# Patient Record
Sex: Female | Born: 1970 | Hispanic: No | Marital: Married | Smoking: Never smoker
Health system: Southern US, Community
[De-identification: ages and names within clinical notes are randomized; demographics above are authoritative.]

## PROBLEM LIST (undated history)

## (undated) ENCOUNTER — Inpatient Hospital Stay (HOSPITAL_COMMUNITY): Payer: Self-pay

## (undated) DIAGNOSIS — T7840XA Allergy, unspecified, initial encounter: Secondary | ICD-10-CM

## (undated) DIAGNOSIS — M1990 Unspecified osteoarthritis, unspecified site: Secondary | ICD-10-CM

## (undated) DIAGNOSIS — O26893 Other specified pregnancy related conditions, third trimester: Secondary | ICD-10-CM

## (undated) DIAGNOSIS — J189 Pneumonia, unspecified organism: Secondary | ICD-10-CM

## (undated) DIAGNOSIS — R0600 Dyspnea, unspecified: Secondary | ICD-10-CM

## (undated) DIAGNOSIS — D219 Benign neoplasm of connective and other soft tissue, unspecified: Secondary | ICD-10-CM

## (undated) DIAGNOSIS — S27309A Unspecified injury of lung, unspecified, initial encounter: Secondary | ICD-10-CM

## (undated) DIAGNOSIS — O09529 Supervision of elderly multigravida, unspecified trimester: Secondary | ICD-10-CM

## (undated) DIAGNOSIS — J45909 Unspecified asthma, uncomplicated: Secondary | ICD-10-CM

## (undated) DIAGNOSIS — E079 Disorder of thyroid, unspecified: Secondary | ICD-10-CM

## (undated) HISTORY — PX: BREAST SURGERY: SHX581

## (undated) HISTORY — PX: OTHER SURGICAL HISTORY: SHX169

## (undated) HISTORY — PX: WISDOM TOOTH EXTRACTION: SHX21

## (undated) HISTORY — DX: Unspecified osteoarthritis, unspecified site: M19.90

## (undated) HISTORY — DX: Disorder of thyroid, unspecified: E07.9

## (undated) HISTORY — DX: Allergy, unspecified, initial encounter: T78.40XA

---

## 2002-06-27 DIAGNOSIS — C801 Malignant (primary) neoplasm, unspecified: Secondary | ICD-10-CM

## 2002-06-27 HISTORY — PX: BREAST SURGERY: SHX581

## 2002-06-27 HISTORY — DX: Malignant (primary) neoplasm, unspecified: C80.1

## 2012-10-18 ENCOUNTER — Other Ambulatory Visit: Payer: Self-pay | Admitting: Obstetrics and Gynecology

## 2012-10-22 ENCOUNTER — Other Ambulatory Visit: Payer: Self-pay

## 2013-08-04 ENCOUNTER — Inpatient Hospital Stay (HOSPITAL_COMMUNITY): Admission: AD | Admit: 2013-08-04 | Payer: Self-pay | Source: Ambulatory Visit | Admitting: Obstetrics and Gynecology

## 2014-02-14 LAB — OB RESULTS CONSOLE HIV ANTIBODY (ROUTINE TESTING): HIV: NONREACTIVE

## 2014-02-14 LAB — OB RESULTS CONSOLE RUBELLA ANTIBODY, IGM: Rubella: IMMUNE

## 2014-02-14 LAB — OB RESULTS CONSOLE ANTIBODY SCREEN: Antibody Screen: NEGATIVE

## 2014-02-14 LAB — OB RESULTS CONSOLE ABO/RH: RH TYPE: POSITIVE

## 2014-02-14 LAB — OB RESULTS CONSOLE HEPATITIS B SURFACE ANTIGEN: Hepatitis B Surface Ag: NEGATIVE

## 2014-06-25 ENCOUNTER — Encounter (HOSPITAL_COMMUNITY): Payer: Self-pay

## 2014-06-25 ENCOUNTER — Inpatient Hospital Stay (HOSPITAL_COMMUNITY)
Admission: AD | Admit: 2014-06-25 | Discharge: 2014-06-25 | Disposition: A | Discharge status: Home / Self Care | Payer: 59 | Source: Ambulatory Visit | Attending: Obstetrics and Gynecology | Admitting: Obstetrics and Gynecology

## 2014-06-25 DIAGNOSIS — O9989 Other specified diseases and conditions complicating pregnancy, childbirth and the puerperium: Secondary | ICD-10-CM | POA: Diagnosis not present

## 2014-06-25 DIAGNOSIS — R0602 Shortness of breath: Secondary | ICD-10-CM | POA: Diagnosis present

## 2014-06-25 DIAGNOSIS — Z3493 Encounter for supervision of normal pregnancy, unspecified, third trimester: Secondary | ICD-10-CM

## 2014-06-25 DIAGNOSIS — R0600 Dyspnea, unspecified: Secondary | ICD-10-CM | POA: Insufficient documentation

## 2014-06-25 DIAGNOSIS — R0689 Other abnormalities of breathing: Secondary | ICD-10-CM | POA: Diagnosis not present

## 2014-06-25 DIAGNOSIS — Z3A33 33 weeks gestation of pregnancy: Secondary | ICD-10-CM | POA: Diagnosis not present

## 2014-06-25 LAB — URINALYSIS, ROUTINE W REFLEX MICROSCOPIC
BILIRUBIN URINE: NEGATIVE
Glucose, UA: NEGATIVE mg/dL
Ketones, ur: NEGATIVE mg/dL
NITRITE: NEGATIVE
PROTEIN: NEGATIVE mg/dL
SPECIFIC GRAVITY, URINE: 1.01 (ref 1.005–1.030)
UROBILINOGEN UA: 0.2 mg/dL (ref 0.0–1.0)
pH: 7 (ref 5.0–8.0)

## 2014-06-25 LAB — URINE MICROSCOPIC-ADD ON

## 2014-06-25 NOTE — MAU Note (Signed)
Patient states she has had shortness of breath off and on for a couple of weeks, worse with movement. Wakes up at night with trouble breathing. Denies bleeding or leaking, no coughing or sore throat. Reports good fetal movement.

## 2014-06-25 NOTE — Discharge Instructions (Signed)
Third Trimester of Pregnancy The third trimester is from week 29 through week 42, months 7 through 9. The third trimester is a time when the fetus is growing rapidly. At the end of the ninth month, the fetus is about 20 inches in length and weighs 6-10 pounds.  BODY CHANGES Your body goes through many changes during pregnancy. The changes vary from woman to woman.   Your weight will continue to increase. You can expect to gain 25-35 pounds (11-16 kg) by the end of the pregnancy.  You may begin to get stretch marks on your hips, abdomen, and breasts.  You may urinate more often because the fetus is moving lower into your pelvis and pressing on your bladder.  You may develop or continue to have heartburn as a result of your pregnancy.  You may develop constipation because certain hormones are causing the muscles that push waste through your intestines to slow down.  You may develop hemorrhoids or swollen, bulging veins (varicose veins).  You may have pelvic pain because of the weight gain and pregnancy hormones relaxing your joints between the bones in your pelvis. Backaches may result from overexertion of the muscles supporting your posture.  You may have changes in your hair. These can include thickening of your hair, rapid growth, and changes in texture. Some women also have hair loss during or after pregnancy, or hair that feels dry or thin. Your hair will most likely return to normal after your baby is born.  Your breasts will continue to grow and be tender. A yellow discharge may leak from your breasts called colostrum.  Your belly button may stick out.  You may feel short of breath because of your expanding uterus.  You may notice the fetus "dropping," or moving lower in your abdomen.  You may have a bloody mucus discharge. This usually occurs a few days to a week before labor begins.  Your cervix becomes thin and soft (effaced) near your due date. WHAT TO EXPECT AT YOUR PRENATAL  EXAMS  You will have prenatal exams every 2 weeks until week 36. Then, you will have weekly prenatal exams. During a routine prenatal visit:  You will be weighed to make sure you and the fetus are growing normally.  Your blood pressure is taken.  Your abdomen will be measured to track your baby's growth.  The fetal heartbeat will be listened to.  Any test results from the previous visit will be discussed.  You may have a cervical check near your due date to see if you have effaced. At around 36 weeks, your caregiver will check your cervix. At the same time, your caregiver will also perform a test on the secretions of the vaginal tissue. This test is to determine if a type of bacteria, Group B streptococcus, is present. Your caregiver will explain this further. Your caregiver may ask you:  What your birth plan is.  How you are feeling.  If you are feeling the baby move.  If you have had any abnormal symptoms, such as leaking fluid, bleeding, severe headaches, or abdominal cramping.  If you have any questions. Other tests or screenings that may be performed during your third trimester include:  Blood tests that check for low iron levels (anemia).  Fetal testing to check the health, activity level, and growth of the fetus. Testing is done if you have certain medical conditions or if there are problems during the pregnancy. FALSE LABOR You may feel small, irregular contractions that  eventually go away. These are called Braxton Hicks contractions, or false labor. Contractions may last for hours, days, or even weeks before true labor sets in. If contractions come at regular intervals, intensify, or become painful, it is best to be seen by your caregiver.  SIGNS OF LABOR   Menstrual-like cramps.  Contractions that are 5 minutes apart or less.  Contractions that start on the top of the uterus and spread down to the lower abdomen and back.  A sense of increased pelvic pressure or back  pain.  A watery or bloody mucus discharge that comes from the vagina. If you have any of these signs before the 37th week of pregnancy, call your caregiver right away. You need to go to the hospital to get checked immediately. HOME CARE INSTRUCTIONS   Avoid all smoking, herbs, alcohol, and unprescribed drugs. These chemicals affect the formation and growth of the baby.  Follow your caregiver's instructions regarding medicine use. There are medicines that are either safe or unsafe to take during pregnancy.  Exercise only as directed by your caregiver. Experiencing uterine cramps is a good sign to stop exercising.  Continue to eat regular, healthy meals.  Wear a good support bra for breast tenderness.  Do not use hot tubs, steam rooms, or saunas.  Wear your seat belt at all times when driving.  Avoid raw meat, uncooked cheese, cat litter boxes, and soil used by cats. These carry germs that can cause birth defects in the baby.  Take your prenatal vitamins.  Try taking a stool softener (if your caregiver approves) if you develop constipation. Eat more high-fiber foods, such as fresh vegetables or fruit and whole grains. Drink plenty of fluids to keep your urine clear or pale yellow.  Take warm sitz baths to soothe any pain or discomfort caused by hemorrhoids. Use hemorrhoid cream if your caregiver approves.  If you develop varicose veins, wear support hose. Elevate your feet for 15 minutes, 3-4 times a day. Limit salt in your diet.  Avoid heavy lifting, wear low heal shoes, and practice good posture.  Rest a lot with your legs elevated if you have leg cramps or low back pain.  Visit your dentist if you have not gone during your pregnancy. Use a soft toothbrush to brush your teeth and be gentle when you floss.  A sexual relationship may be continued unless your caregiver directs you otherwise.  Do not travel far distances unless it is absolutely necessary and only with the approval  of your caregiver.  Take prenatal classes to understand, practice, and ask questions about the labor and delivery.  Make a trial run to the hospital.  Pack your hospital bag.  Prepare the baby's nursery.  Continue to go to all your prenatal visits as directed by your caregiver. SEEK MEDICAL CARE IF:  You are unsure if you are in labor or if your water has broken.  You have dizziness.  You have mild pelvic cramps, pelvic pressure, or nagging pain in your abdominal area.  You have persistent nausea, vomiting, or diarrhea.  You have a bad smelling vaginal discharge.  You have pain with urination. SEEK IMMEDIATE MEDICAL CARE IF:   You have a fever.  You are leaking fluid from your vagina.  You have spotting or bleeding from your vagina.  You have severe abdominal cramping or pain.  You have rapid weight loss or gain.  You have shortness of breath with chest pain.  You notice sudden or extreme swelling  of your face, hands, ankles, feet, or legs.  You have not felt your baby move in over an hour.  You have severe headaches that do not go away with medicine.  You have vision changes. Document Released: 06/07/2001 Document Revised: 06/18/2013 Document Reviewed: 08/14/2012 [facility] Patient Information 2015 [place], [place]. This information is not intended to replace advice given to you by your health care provider. Make sure you discuss any questions you have with your health care provider. Shortness of Breath Shortness of breath means you have trouble breathing. It could also mean that you have a medical problem. You should get immediate medical care for shortness of breath. CAUSES   Not enough oxygen in the air such as with high altitudes or a smoke-filled room.  Certain lung diseases, infections, or problems.  Heart disease or conditions, such as angina or heart failure.  Low red blood cells (anemia).  Poor physical fitness, which can cause shortness of breath  when you exercise.  Chest or back injuries or stiffness.  Being overweight.  Smoking.  Anxiety, which can make you feel like you are not getting enough air. DIAGNOSIS  Serious medical problems can often be found during your physical exam. Tests may also be done to determine why you are having shortness of breath. Tests may include:  Chest X-rays.  Lung function tests.  Blood tests.  An electrocardiogram (ECG).  An ambulatory electrocardiogram. An ambulatory ECG records your heartbeat patterns over a 24-hour period.  Exercise testing.  A transthoracic echocardiogram (TTE). During echocardiography, sound waves are used to evaluate how blood flows through your heart.  A transesophageal echocardiogram (TEE).  Imaging scans. Your health care provider may not be able to find a cause for your shortness of breath after your exam. In this case, it is important to have a follow-up exam with your health care provider as directed.  TREATMENT  Treatment for shortness of breath depends on the cause of your symptoms and can vary greatly. HOME CARE INSTRUCTIONS   Do not smoke. Smoking is a common cause of shortness of breath. If you smoke, ask for help to quit.  Avoid being around chemicals or things that may bother your breathing, such as paint fumes and dust.  Rest as needed. Slowly resume your usual activities.  If medicines were prescribed, take them as directed for the full length of time directed. This includes oxygen and any inhaled medicines.  Keep all follow-up appointments as directed by your health care provider. SEEK MEDICAL CARE IF:   Your condition does not improve in the time expected.  You have a hard time doing your normal activities even with rest.  You have any new symptoms. SEEK IMMEDIATE MEDICAL CARE IF:   Your shortness of breath gets worse.  You feel light-headed, faint, or develop a cough not controlled with medicines.  You start coughing up  blood.  You have pain with breathing.  You have chest pain or pain in your arms, shoulders, or abdomen.  You have a fever.  You are unable to walk up stairs or exercise the way you normally do. MAKE SURE YOU:  Understand these instructions.  Will watch your condition.  Will get help right away if you are not doing well or get worse. Document Released: 03/08/2001 Document Revised: 06/18/2013 Document Reviewed: 08/29/2011 [facility] Patient Information 2015 [place], [place]. This information is not intended to replace advice given to you by your health care provider. Make sure you discuss any questions you have with  your health care provider.

## 2014-06-25 NOTE — MAU Provider Note (Signed)
History     CSN: [id number]  Arrival date and time: 06/25/14 1102   None     Chief Complaint  Patient presents with  . Shortness of Breath   HPI Haley Strong is 43 y.o. G1P0 [redacted]w[redacted]d weeks presenting with difficulty with breathing with climbing steps, walking, sometimes at night.  Feels like she needs O2.  She denies chest pain, fever, chills and congestion.  Hx of recurrent pneumonia as a child.   She is a patient of Dr. Harrington Challenger.  Seen in the office today by Dr. Philis Pique. Sent here for futher evaluation.  Denies vaginal bleeding or leaking of fluid.  A little cramping this week but denies contractions  Pregnancy has been uncomplicated.  Hypoglycemia early on--careful with diet.    History reviewed. No pertinent past medical history.  History reviewed. No pertinent past surgical history.  Family History  Problem Relation Age of Onset  . Cancer Mother     History  Substance Use Topics  . Smoking status: Never Smoker   . Smokeless tobacco: Never Used  . Alcohol Use: No    Allergies:  Allergies  Allergen Reactions  . Sulfa Antibiotics Nausea And Vomiting    No prescriptions prior to admission    Review of Systems  Constitutional: Negative for fever, chills and malaise/fatigue.  Respiratory: Positive for shortness of breath. Negative for cough, hemoptysis, sputum production and wheezing.   Cardiovascular: Negative for chest pain and leg swelling.       + for difficulty breathing with walking, climbing stairs and sometimes at night  Gastrointestinal: Negative for heartburn and abdominal pain.  Genitourinary:       Neg for vaginal bleeding or leaking of fluid  Neurological: Negative for headaches.   Physical Exam   Blood pressure 108/80, pulse 91, temperature 98.2 F (36.8 C), temperature source Oral, resp. rate 16, height 5' 8.5" (1.74 m), weight 160 lb 9.6 oz (72.848 kg), SpO2 100 %.  Physical Exam  Constitutional: She is oriented to person, place, and time. She appears  well-developed and well-nourished. No distress.  HENT:  Head: Normocephalic.  Cardiovascular: Normal rate and regular rhythm.   Respiratory: Effort normal and breath sounds normal. No respiratory distress. She has no wheezes. She has no rales. She exhibits no tenderness.  Musculoskeletal: She exhibits no edema or tenderness.  In lower extremities  Neurological: She is alert and oriented to person, place, and time.  Skin: Skin is warm and dry.  Psychiatric: She has a normal mood and affect. Her behavior is normal.   Results for orders placed or performed during the hospital encounter of 06/25/14 (from the past 24 hour(s))  Urinalysis, Routine w reflex microscopic     Status: Abnormal   Collection Time: 06/25/14 12:16 PM  Result Value Ref Range   Color, Urine YELLOW YELLOW   APPearance CLEAR CLEAR   Specific Gravity, Urine 1.010 1.005 - 1.030   pH 7.0 5.0 - 8.0   Glucose, UA NEGATIVE NEGATIVE mg/dL   Hgb urine dipstick TRACE (A) NEGATIVE   Bilirubin Urine NEGATIVE NEGATIVE   Ketones, ur NEGATIVE NEGATIVE mg/dL   Protein, ur NEGATIVE NEGATIVE mg/dL   Urobilinogen, UA 0.2 0.0 - 1.0 mg/dL   Nitrite NEGATIVE NEGATIVE   Leukocytes, UA LARGE (A) NEGATIVE  Urine microscopic-add on     Status: Abnormal   Collection Time: 06/25/14 12:16 PM  Result Value Ref Range   Squamous Epithelial / LPF FEW (A) RARE   WBC, UA 11-20 <3 WBC/hpf  Bacteria, UA FEW (A) RARE   NST reactive  O2Sats--99% and 100%  MAU Course  Procedures  Discussed CXR with the patient-per Dr. Malachi Carl order--patient declines CXR  MDM Reported MSE to Dr. Rogue Bussing.  Reported patient declined CXR--she is not febrile, does not have congestion/productive cough, neg for chills  Assessment and Plan  A:  [redacted] weeks gestation with difficulty breathing  P:  Instructed patient to report any worsening of sxs, vaginal bleeding, decreased fetal movement, or loss of fluid to Dr. Harrington Challenger      Keep scheduled appointment              Lubertha Leite,EVE M 06/25/2014, 1:16 PM

## 2014-07-31 ENCOUNTER — Inpatient Hospital Stay (HOSPITAL_COMMUNITY)
Admission: AD | Admit: 2014-07-31 | Discharge: 2014-07-31 | Disposition: A | Discharge status: Home / Self Care | Payer: 59 | Source: Ambulatory Visit | Attending: Obstetrics and Gynecology | Admitting: Obstetrics and Gynecology

## 2014-07-31 ENCOUNTER — Encounter (HOSPITAL_COMMUNITY): Payer: Self-pay | Admitting: Family

## 2014-07-31 ENCOUNTER — Inpatient Hospital Stay (HOSPITAL_COMMUNITY): Payer: 59

## 2014-07-31 DIAGNOSIS — O9989 Other specified diseases and conditions complicating pregnancy, childbirth and the puerperium: Secondary | ICD-10-CM | POA: Insufficient documentation

## 2014-07-31 DIAGNOSIS — Z3A38 38 weeks gestation of pregnancy: Secondary | ICD-10-CM | POA: Diagnosis not present

## 2014-07-31 DIAGNOSIS — R0602 Shortness of breath: Secondary | ICD-10-CM | POA: Insufficient documentation

## 2014-07-31 DIAGNOSIS — R0682 Tachypnea, not elsewhere classified: Secondary | ICD-10-CM

## 2014-07-31 LAB — CBC
HEMATOCRIT: 34 % — AB (ref 36.0–46.0)
Hemoglobin: 12 g/dL (ref 12.0–15.0)
MCH: 30.9 pg (ref 26.0–34.0)
MCHC: 35.3 g/dL (ref 30.0–36.0)
MCV: 87.6 fL (ref 78.0–100.0)
Platelets: 139 10*3/uL — ABNORMAL LOW (ref 150–400)
RBC: 3.88 MIL/uL (ref 3.87–5.11)
RDW: 13.9 % (ref 11.5–15.5)
WBC: 9.1 10*3/uL (ref 4.0–10.5)

## 2014-07-31 LAB — COMPREHENSIVE METABOLIC PANEL
ALT: 29 U/L (ref 0–35)
ANION GAP: 3 — AB (ref 5–15)
AST: 28 U/L (ref 0–37)
Albumin: 3 g/dL — ABNORMAL LOW (ref 3.5–5.2)
Alkaline Phosphatase: 194 U/L — ABNORMAL HIGH (ref 39–117)
BUN: 11 mg/dL (ref 6–23)
CALCIUM: 8.7 mg/dL (ref 8.4–10.5)
CO2: 20 mmol/L (ref 19–32)
Chloride: 109 mmol/L (ref 96–112)
Creatinine, Ser: 0.66 mg/dL (ref 0.50–1.10)
GFR calc non Af Amer: >90 mL/min (ref >90)
Glucose, Bld: 89 mg/dL (ref 70–99)
Potassium: 3.6 mmol/L (ref 3.5–5.1)
SODIUM: 132 mmol/L — AB (ref 135–145)
Total Bilirubin: 0.5 mg/dL (ref 0.3–1.2)
Total Protein: 6.1 g/dL (ref 6.0–8.3)

## 2014-07-31 LAB — BRAIN NATRIURETIC PEPTIDE: B NATRIURETIC PEPTIDE 5: 30 pg/mL (ref 0.0–100.0)

## 2014-07-31 MED ORDER — ZOLPIDEM TARTRATE 5 MG PO TABS: 5.0000 mg | ORAL_TABLET | Freq: Every evening | ORAL | Status: DC | PRN

## 2014-07-31 MED ORDER — ALBUTEROL SULFATE HFA 108 (90 BASE) MCG/ACT IN AERS: 2.0000 | INHALATION_SPRAY | RESPIRATORY_TRACT | Status: AC | PRN
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
## 2014-07-31 NOTE — MAU Note (Signed)
Urine in lab 

## 2014-08-06 ENCOUNTER — Encounter (HOSPITAL_COMMUNITY): Payer: Self-pay | Admitting: *Deleted

## 2014-08-07 ENCOUNTER — Inpatient Hospital Stay (HOSPITAL_COMMUNITY)
Admission: RE | Admit: 2014-08-07 | Discharge: 2014-08-09 | DRG: 766 | Disposition: A | Discharge status: Home / Self Care | Payer: 59 | Source: Ambulatory Visit | Attending: Obstetrics and Gynecology | Admitting: Obstetrics and Gynecology

## 2014-08-07 ENCOUNTER — Encounter (HOSPITAL_COMMUNITY)
Admission: RE | Disposition: A | Discharge status: Home / Self Care | Payer: Self-pay | Source: Ambulatory Visit | Attending: Obstetrics and Gynecology

## 2014-08-07 ENCOUNTER — Encounter (HOSPITAL_COMMUNITY): Payer: Self-pay | Admitting: Certified Registered"

## 2014-08-07 ENCOUNTER — Other Ambulatory Visit: Payer: Self-pay | Admitting: Obstetrics and Gynecology

## 2014-08-07 ENCOUNTER — Inpatient Hospital Stay (HOSPITAL_COMMUNITY): Payer: 59 | Admitting: Anesthesiology

## 2014-08-07 DIAGNOSIS — O09513 Supervision of elderly primigravida, third trimester: Secondary | ICD-10-CM

## 2014-08-07 DIAGNOSIS — O3413 Maternal care for benign tumor of corpus uteri, third trimester: Secondary | ICD-10-CM | POA: Diagnosis present

## 2014-08-07 DIAGNOSIS — D259 Leiomyoma of uterus, unspecified: Secondary | ICD-10-CM | POA: Diagnosis present

## 2014-08-07 DIAGNOSIS — Z3A39 39 weeks gestation of pregnancy: Secondary | ICD-10-CM | POA: Diagnosis present

## 2014-08-07 HISTORY — DX: Dyspnea, unspecified: R06.00

## 2014-08-07 HISTORY — DX: Other specified pregnancy related conditions, third trimester: O26.893

## 2014-08-07 HISTORY — DX: Pneumonia, unspecified organism: J18.9

## 2014-08-07 LAB — CBC
HEMATOCRIT: 36.1 % (ref 36.0–46.0)
Hemoglobin: 12.6 g/dL (ref 12.0–15.0)
MCH: 31.3 pg (ref 26.0–34.0)
MCHC: 34.9 g/dL (ref 30.0–36.0)
MCV: 89.6 fL (ref 78.0–100.0)
Platelets: 127 10*3/uL — ABNORMAL LOW (ref 150–400)
RBC: 4.03 MIL/uL (ref 3.87–5.11)
RDW: 14.2 % (ref 11.5–15.5)
WBC: 8.4 10*3/uL (ref 4.0–10.5)

## 2014-08-07 LAB — TYPE AND SCREEN
ABO/RH(D): O POS
Antibody Screen: NEGATIVE

## 2014-08-07 LAB — ABO/RH: ABO/RH(D): O POS

## 2014-08-07 SURGERY — Surgical Case
Anesthesia: Epidural

## 2014-08-07 MED ORDER — ACETAMINOPHEN 500 MG PO TABS
1000.0000 mg | ORAL_TABLET | Freq: Four times a day (QID) | ORAL | Status: AC
  Administered 2014-08-07 – 2014-08-08 (×3): 1000 mg via ORAL
  Filled 2014-08-07 (×3): qty 2

## 2014-08-07 MED ORDER — IBUPROFEN 600 MG PO TABS: 600.0000 mg | ORAL_TABLET | Freq: Four times a day (QID) | ORAL | Status: DC | PRN

## 2014-08-07 MED ORDER — MORPHINE SULFATE 0.5 MG/ML IJ SOLN
INTRAMUSCULAR | Status: AC
  Filled 2014-08-07: qty 10

## 2014-08-07 MED ORDER — KETOROLAC TROMETHAMINE 30 MG/ML IJ SOLN: 30.0000 mg | Freq: Four times a day (QID) | INTRAMUSCULAR | Status: DC | PRN

## 2014-08-07 MED ORDER — ONDANSETRON HCL 4 MG/2ML IJ SOLN
INTRAMUSCULAR | Status: DC | PRN
  Administered 2014-08-07: 4 mg via INTRAVENOUS

## 2014-08-07 MED ORDER — SIMETHICONE 80 MG PO CHEW
80.0000 mg | CHEWABLE_TABLET | Freq: Three times a day (TID) | ORAL | Status: DC
  Administered 2014-08-07 – 2014-08-09 (×6): 80 mg via ORAL
  Filled 2014-08-07 (×5): qty 1

## 2014-08-07 MED ORDER — SCOPOLAMINE 1 MG/3DAYS TD PT72: 1.0000 | MEDICATED_PATCH | Freq: Once | TRANSDERMAL | Status: DC

## 2014-08-07 MED ORDER — LACTATED RINGERS IV SOLN: INTRAVENOUS | Status: DC

## 2014-08-07 MED ORDER — FENTANYL CITRATE 0.05 MG/ML IJ SOLN: 25.0000 ug | INTRAMUSCULAR | Status: DC | PRN

## 2014-08-07 MED ORDER — ONDANSETRON HCL 4 MG PO TABS: 4.0000 mg | ORAL_TABLET | ORAL | Status: DC | PRN

## 2014-08-07 MED ORDER — PRENATAL MULTIVITAMIN CH
1.0000 | ORAL_TABLET | Freq: Every day | ORAL | Status: DC
  Filled 2014-08-07 (×2): qty 1

## 2014-08-07 MED ORDER — LACTATED RINGERS IV SOLN
INTRAVENOUS | Status: DC
  Administered 2014-08-07 (×3): via INTRAVENOUS

## 2014-08-07 MED ORDER — NALBUPHINE HCL 10 MG/ML IJ SOLN
5.0000 mg | INTRAMUSCULAR | Status: DC | PRN
  Administered 2014-08-08 (×2): 5 mg via SUBCUTANEOUS
  Filled 2014-08-07: qty 1

## 2014-08-07 MED ORDER — PHENYLEPHRINE HCL 10 MG/ML IJ SOLN
INTRAMUSCULAR | Status: DC | PRN
  Administered 2014-08-07: 80 ug via INTRAVENOUS
  Administered 2014-08-07 (×2): 40 ug via INTRAVENOUS

## 2014-08-07 MED ORDER — SODIUM CHLORIDE 0.9 % IJ SOLN: 3.0000 mL | INTRAMUSCULAR | Status: DC | PRN

## 2014-08-07 MED ORDER — NALBUPHINE HCL 10 MG/ML IJ SOLN: 5.0000 mg | Freq: Once | INTRAMUSCULAR | Status: AC | PRN

## 2014-08-07 MED ORDER — SIMETHICONE 80 MG PO CHEW: 80.0000 mg | CHEWABLE_TABLET | ORAL | Status: DC | PRN

## 2014-08-07 MED ORDER — OXYCODONE-ACETAMINOPHEN 5-325 MG PO TABS
1.0000 | ORAL_TABLET | ORAL | Status: DC | PRN
  Administered 2014-08-08 (×3): 1 via ORAL
  Filled 2014-08-07 (×4): qty 1

## 2014-08-07 MED ORDER — ONDANSETRON HCL 4 MG/2ML IJ SOLN: 4.0000 mg | INTRAMUSCULAR | Status: DC | PRN

## 2014-08-07 MED ORDER — DIPHENHYDRAMINE HCL 50 MG/ML IJ SOLN: 12.5000 mg | INTRAMUSCULAR | Status: DC | PRN

## 2014-08-07 MED ORDER — LANOLIN HYDROUS EX OINT: 1.0000 "application " | TOPICAL_OINTMENT | CUTANEOUS | Status: DC | PRN

## 2014-08-07 MED ORDER — ONDANSETRON HCL 4 MG/2ML IJ SOLN: 4.0000 mg | Freq: Three times a day (TID) | INTRAMUSCULAR | Status: DC | PRN

## 2014-08-07 MED ORDER — IBUPROFEN 600 MG PO TABS
600.0000 mg | ORAL_TABLET | Freq: Four times a day (QID) | ORAL | Status: DC
  Administered 2014-08-07 – 2014-08-09 (×8): 600 mg via ORAL
  Filled 2014-08-07 (×8): qty 1

## 2014-08-07 MED ORDER — METHYLERGONOVINE MALEATE 0.2 MG PO TABS: 0.2000 mg | ORAL_TABLET | ORAL | Status: DC | PRN

## 2014-08-07 MED ORDER — PROMETHAZINE HCL 25 MG/ML IJ SOLN: 6.2500 mg | INTRAMUSCULAR | Status: DC | PRN

## 2014-08-07 MED ORDER — 0.9 % SODIUM CHLORIDE (POUR BTL) OPTIME
TOPICAL | Status: DC | PRN
  Administered 2014-08-07: 1000 mL

## 2014-08-07 MED ORDER — MEPERIDINE HCL 25 MG/ML IJ SOLN: 6.2500 mg | INTRAMUSCULAR | Status: DC | PRN

## 2014-08-07 MED ORDER — SIMETHICONE 80 MG PO CHEW
80.0000 mg | CHEWABLE_TABLET | ORAL | Status: DC
  Administered 2014-08-07 – 2014-08-09 (×2): 80 mg via ORAL
  Filled 2014-08-07 (×2): qty 1

## 2014-08-07 MED ORDER — SCOPOLAMINE 1 MG/3DAYS TD PT72
MEDICATED_PATCH | TRANSDERMAL | Status: AC
  Filled 2014-08-07: qty 1

## 2014-08-07 MED ORDER — MEPERIDINE HCL 25 MG/ML IJ SOLN
INTRAMUSCULAR | Status: DC | PRN
  Administered 2014-08-07 (×2): 12.5 mg via INTRAVENOUS

## 2014-08-07 MED ORDER — DIBUCAINE 1 % RE OINT: 1.0000 "application " | TOPICAL_OINTMENT | RECTAL | Status: DC | PRN

## 2014-08-07 MED ORDER — SENNOSIDES-DOCUSATE SODIUM 8.6-50 MG PO TABS
2.0000 | ORAL_TABLET | ORAL | Status: DC
  Administered 2014-08-07 – 2014-08-09 (×2): 2 via ORAL
  Filled 2014-08-07 (×2): qty 2

## 2014-08-07 MED ORDER — CEFAZOLIN SODIUM-DEXTROSE 2-3 GM-% IV SOLR
2.0000 g | INTRAVENOUS | Status: AC
  Administered 2014-08-07: 2 g via INTRAVENOUS

## 2014-08-07 MED ORDER — WITCH HAZEL-GLYCERIN EX PADS: 1.0000 "application " | MEDICATED_PAD | CUTANEOUS | Status: DC | PRN

## 2014-08-07 MED ORDER — PHENYLEPHRINE 8 MG IN D5W 100 ML (0.08MG/ML) PREMIX OPTIME
INJECTION | INTRAVENOUS | Status: DC | PRN
  Administered 2014-08-07: 60 ug/min via INTRAVENOUS

## 2014-08-07 MED ORDER — ONDANSETRON HCL 4 MG/2ML IJ SOLN
INTRAMUSCULAR | Status: AC
  Filled 2014-08-07: qty 2

## 2014-08-07 MED ORDER — BUPIVACAINE HCL 0.25 % IJ SOLN
INTRAMUSCULAR | Status: DC | PRN
  Administered 2014-08-07: 30 mL

## 2014-08-07 MED ORDER — OXYCODONE-ACETAMINOPHEN 5-325 MG PO TABS
2.0000 | ORAL_TABLET | ORAL | Status: DC | PRN
  Administered 2014-08-09 (×2): 2 via ORAL
  Filled 2014-08-07 (×2): qty 2

## 2014-08-07 MED ORDER — METHYLERGONOVINE MALEATE 0.2 MG/ML IJ SOLN: 0.2000 mg | INTRAMUSCULAR | Status: DC | PRN

## 2014-08-07 MED ORDER — ACETAMINOPHEN 160 MG/5ML PO SOLN: 325.0000 mg | ORAL | Status: DC | PRN

## 2014-08-07 MED ORDER — FENTANYL CITRATE 0.05 MG/ML IJ SOLN
INTRAMUSCULAR | Status: AC
  Filled 2014-08-07: qty 2

## 2014-08-07 MED ORDER — OXYTOCIN 40 UNITS IN LACTATED RINGERS INFUSION - SIMPLE MED: 62.5000 mL/h | INTRAVENOUS | Status: AC

## 2014-08-07 MED ORDER — SODIUM BICARBONATE 8.4 % IV SOLN
INTRAVENOUS | Status: DC | PRN
  Administered 2014-08-07: 4 mL via EPIDURAL
  Administered 2014-08-07 (×3): 5 mL via EPIDURAL

## 2014-08-07 MED ORDER — DIPHENHYDRAMINE HCL 25 MG PO CAPS
25.0000 mg | ORAL_CAPSULE | Freq: Four times a day (QID) | ORAL | Status: DC | PRN
  Filled 2014-08-07: qty 1

## 2014-08-07 MED ORDER — NALBUPHINE HCL 10 MG/ML IJ SOLN
5.0000 mg | INTRAMUSCULAR | Status: DC | PRN
  Filled 2014-08-07: qty 1

## 2014-08-07 MED ORDER — BUPIVACAINE HCL (PF) 0.25 % IJ SOLN
INTRAMUSCULAR | Status: AC
  Filled 2014-08-07: qty 10

## 2014-08-07 MED ORDER — BUPIVACAINE HCL (PF) 0.25 % IJ SOLN
INTRAMUSCULAR | Status: AC
  Filled 2014-08-07: qty 30

## 2014-08-07 MED ORDER — ACETAMINOPHEN 325 MG PO TABS: 325.0000 mg | ORAL_TABLET | ORAL | Status: DC | PRN

## 2014-08-07 MED ORDER — OXYTOCIN 10 UNIT/ML IJ SOLN
INTRAMUSCULAR | Status: AC
  Filled 2014-08-07: qty 4

## 2014-08-07 MED ORDER — OXYTOCIN 10 UNIT/ML IJ SOLN
40.0000 [IU] | INTRAMUSCULAR | Status: DC | PRN
  Administered 2014-08-07: 40 [IU] via INTRAVENOUS

## 2014-08-07 MED ORDER — MENTHOL 3 MG MT LOZG: 1.0000 | LOZENGE | OROMUCOSAL | Status: DC | PRN

## 2014-08-07 MED ORDER — NALOXONE HCL 1 MG/ML IJ SOLN
1.0000 ug/kg/h | INTRAMUSCULAR | Status: DC | PRN
  Filled 2014-08-07: qty 2

## 2014-08-07 MED ORDER — DIPHENHYDRAMINE HCL 25 MG PO CAPS
25.0000 mg | ORAL_CAPSULE | ORAL | Status: DC | PRN
  Administered 2014-08-08: 25 mg via ORAL

## 2014-08-07 MED ORDER — MIDAZOLAM HCL 2 MG/2ML IJ SOLN: 0.5000 mg | Freq: Once | INTRAMUSCULAR | Status: DC | PRN

## 2014-08-07 MED ORDER — ALBUTEROL SULFATE (2.5 MG/3ML) 0.083% IN NEBU: 2.0000 mL | INHALATION_SOLUTION | RESPIRATORY_TRACT | Status: DC | PRN

## 2014-08-07 MED ORDER — ZOLPIDEM TARTRATE 5 MG PO TABS: 5.0000 mg | ORAL_TABLET | Freq: Every evening | ORAL | Status: DC | PRN

## 2014-08-07 MED ORDER — FENTANYL CITRATE 0.05 MG/ML IJ SOLN
INTRAMUSCULAR | Status: DC | PRN
  Administered 2014-08-07 (×4): 25 ug via INTRAVENOUS

## 2014-08-07 MED ORDER — TETANUS-DIPHTH-ACELL PERTUSSIS 5-2.5-18.5 LF-MCG/0.5 IM SUSP: 0.5000 mL | Freq: Once | INTRAMUSCULAR | Status: DC

## 2014-08-07 MED ORDER — MORPHINE SULFATE (PF) 0.5 MG/ML IJ SOLN
INTRAMUSCULAR | Status: DC | PRN
  Administered 2014-08-07 (×2): 1 mg via INTRAVENOUS
  Administered 2014-08-07: 3 mg via EPIDURAL

## 2014-08-07 MED ORDER — LACTATED RINGERS IV SOLN
INTRAVENOUS | Status: DC | PRN
  Administered 2014-08-07: 11:00:00 via INTRAVENOUS

## 2014-08-07 MED ORDER — NALOXONE HCL 0.4 MG/ML IJ SOLN
0.4000 mg | INTRAMUSCULAR | Status: DC | PRN
Start: 2014-08-07 — End: 2014-08-09

## 2014-08-07 MED ORDER — MEPERIDINE HCL 25 MG/ML IJ SOLN
INTRAMUSCULAR | Status: AC
  Filled 2014-08-07: qty 1

## 2014-08-07 MED ORDER — BUPIVACAINE LIPOSOME 1.3 % IJ SUSP
20.0000 mL | Freq: Once | INTRAMUSCULAR | Status: AC
  Administered 2014-08-07: 20 mL
  Filled 2014-08-07: qty 20

## 2014-08-07 MED ORDER — CEFAZOLIN SODIUM-DEXTROSE 2-3 GM-% IV SOLR
INTRAVENOUS | Status: AC
  Filled 2014-08-07: qty 50

## 2014-08-07 MED ORDER — SCOPOLAMINE 1 MG/3DAYS TD PT72
1.0000 | MEDICATED_PATCH | Freq: Once | TRANSDERMAL | Status: DC
  Filled 2014-08-07: qty 1

## 2014-08-07 MED ORDER — PHENYLEPHRINE 8 MG IN D5W 100 ML (0.08MG/ML) PREMIX OPTIME
INJECTION | INTRAVENOUS | Status: AC
  Filled 2014-08-07: qty 100

## 2014-08-07 SURGICAL SUPPLY — 27 items
CLAMP CORD UMBIL (MISCELLANEOUS) IMPLANT
CLOTH BEACON ORANGE TIMEOUT ST (SAFETY) ×3 IMPLANT
DRAPE SHEET LG 3/4 BI-LAMINATE (DRAPES) IMPLANT
DRSG OPSITE POSTOP 4X10 (GAUZE/BANDAGES/DRESSINGS) ×3 IMPLANT
DURAPREP 26ML APPLICATOR (WOUND CARE) ×3 IMPLANT
ELECT REM PT RETURN 9FT ADLT (ELECTROSURGICAL) ×3
ELECTRODE REM PT RTRN 9FT ADLT (ELECTROSURGICAL) ×1 IMPLANT
EXTRACTOR VACUUM M CUP 4 TUBE (SUCTIONS) IMPLANT
EXTRACTOR VACUUM M CUP 4' TUBE (SUCTIONS)
GLOVE BIO SURGEON STRL SZ7 (GLOVE) ×3 IMPLANT
GOWN STRL REUS W/TWL LRG LVL3 (GOWN DISPOSABLE) ×6 IMPLANT
KIT ABG SYR 3ML LUER SLIP (SYRINGE) IMPLANT
LIQUID BAND (GAUZE/BANDAGES/DRESSINGS) ×3 IMPLANT
NEEDLE HYPO 25X5/8 SAFETYGLIDE (NEEDLE) IMPLANT
NS IRRIG 1000ML POUR BTL (IV SOLUTION) ×3 IMPLANT
PACK C SECTION WH (CUSTOM PROCEDURE TRAY) ×3 IMPLANT
PAD OB MATERNITY 4.3X12.25 (PERSONAL CARE ITEMS) ×3 IMPLANT
RTRCTR C-SECT PINK 25CM LRG (MISCELLANEOUS) ×3 IMPLANT
STAPLER VISISTAT 35W (STAPLE) IMPLANT
SUT CHROMIC 1 CTX 36 (SUTURE) ×6 IMPLANT
SUT CHROMIC 2 0 CT 1 (SUTURE) ×3 IMPLANT
SUT PDS AB 0 CTX 60 (SUTURE) ×3 IMPLANT
SUT VIC AB 2-0 CT1 27 (SUTURE) ×2
SUT VIC AB 2-0 CT1 TAPERPNT 27 (SUTURE) ×1 IMPLANT
SUT VIC AB 4-0 KS 27 (SUTURE) ×3 IMPLANT
TOWEL OR 17X24 6PK STRL BLUE (TOWEL DISPOSABLE) ×3 IMPLANT
TRAY FOLEY CATH 14FR (SET/KITS/TRAYS/PACK) ×3 IMPLANT

## 2014-08-07 NOTE — Anesthesia Procedure Notes (Addendum)
Epidural Patient location during procedure: OB Start time: 08/07/2014 9:58 AM  Staffing Anesthesiologist: Rudean Curt Performed by: anesthesiologist   Preanesthetic Checklist Completed: patient identified, site marked, surgical consent, pre-op evaluation, timeout performed, IV checked, risks and benefits discussed and monitors and equipment checked  Epidural Patient position: sitting Prep: site prepped and draped and DuraPrep Patient monitoring: continuous pulse ox and blood pressure Approach: midline Location: L3-L4 Injection technique: LOR air  Needle:  Needle type: Tuohy  Needle gauge: 17 G Needle length: 9 cm and 9 Needle insertion depth: 6 cm Catheter type: closed end flexible Catheter size: 19 Gauge Catheter at skin depth: 10 cm Test dose: negative  Assessment Events: blood not aspirated, injection not painful, no injection resistance, negative IV test and no paresthesia  Additional Notes Patient identified.  Risk benefits discussed including failed block, incomplete pain control, headache, nerve damage, paralysis, blood pressure changes, nausea, vomiting, reactions to medication both toxic or allergic, and postpartum back pain.  Patient expressed understanding and wished to proceed.  All questions were answered.  Sterile technique used throughout procedure and epidural site dressed with sterile barrier dressing. No paresthesia or other complications noted.The patient did not experience any signs of intravascular injection such as tinnitus or metallic taste in mouth nor signs of intrathecal spread such as rapid motor block. Please see nursing notes for vital signs.   Epidural Patient location during procedure: OB Start time: 08/07/2014 10:11 AM  Staffing Anesthesiologist: Rudean Curt Performed by: anesthesiologist   Preanesthetic Checklist Completed: patient identified, site marked, surgical consent, pre-op evaluation, timeout performed, IV checked, risks  and benefits discussed and monitors and equipment checked  Epidural Patient position: sitting Prep: site prepped and draped and DuraPrep Patient monitoring: continuous pulse ox and blood pressure Approach: midline Location: L3-L4 Injection technique: LOR air  Needle:  Needle type: Tuohy  Needle gauge: 17 G Needle length: 9 cm and 9 Needle insertion depth: 6 cm Catheter type: closed end flexible Catheter size: 19 Gauge Catheter at skin depth: 10 cm Test dose: negative  Assessment Events: blood not aspirated, injection not painful, no injection resistance, negative IV test and no paresthesia  Additional Notes Patient identified.  Risk benefits discussed including failed block, incomplete pain control, headache, nerve damage, paralysis, blood pressure changes, nausea, vomiting, reactions to medication both toxic or allergic, and postpartum back pain.  Patient expressed understanding and wished to proceed.  All questions were answered.  Sterile technique used throughout procedure and epidural site dressed with sterile barrier dressing. No paresthesia or other complications noted.The patient did not experience any signs of intravascular injection such as tinnitus or metallic taste in mouth nor signs of intrathecal spread such as rapid motor block. Please see nursing notes for vital signs.   - same level... Moved to left 1 cm

## 2014-08-07 NOTE — Lactation Note (Signed)
This note was copied from the chart of [place]. Lactation Consultation Note  Patient Name: Haley Strong [id number]'R Date: 08/07/2014 Reason for consult: Initial assessment;Difficult latch;Breast surgery (lumpectomy and breast radiation (L) breast 10 years ago) This is mother's first child and she is attempting to breastfeed baby on her (L) breast in upright football position.  FOB is at bedside and LC showed both parents how to assist baby to latch and sustain latch.  Baby latches for about 7 minutes, then re-latches for additional 7-8 minutes, and is asleep after being removed from breast.  Mom has expressible drops of yellow colostrum from this (L) breast.  Mom had lumpectomy and radiation on this (L) breast 10 years ago.  She states that the incision is below her breast.  LC unable to assess because visitors arrived and parents wanted them to come into room.  LC reported assessment and feeding to their RN, Miguel Rota.  LC also provided parents with a copy of handout from "[employer]" by Wyonia Hough, IBCLC's.  This describes possible effects on breast tissue from lumpectomy and radiation but although milk production can be lessened on affected breast and milk may be thicker and higher in sodium and chloride levels, it is not known to be harmful to breastfeeding baby.  LC encouraged frequent STS and cue feedings, discussing normal newborn sleepiness, signs of proper latch and milk transfer and minimum feeding frequency once baby over 24 hours oldMom encouraged to feed baby 8-12 times/24 hours and with feeding cues. LC encouraged review of Baby and Me pp 9, 14 and 20-25 for STS and BF information. LC provided [employer] brochure and reviewed [facility] services and list of community and web site resources. .    Maternal Data Formula Feeding for Exclusion: No Has patient been taught Hand Expression?: Yes Does the patient have breastfeeding experience prior to this delivery?:  No  Feeding Feeding Type: Breast Fed Length of feed: 15 min  LATCH Score/Interventions Latch: Repeated attempts needed to sustain latch, nipple held in mouth throughout feeding, stimulation needed to elicit sucking reflex. Intervention(s): Skin to skin;Teach feeding cues;Waking techniques Intervention(s): Adjust position;Assist with latch;Breast compression  Audible Swallowing: Spontaneous and intermittent Intervention(s): Skin to skin;Hand expression  Type of Nipple: Everted at rest and after stimulation  Comfort (Breast/Nipple): Soft / non-tender     Hold (Positioning): Assistance needed to correctly position infant at breast and maintain latch. Intervention(s): Breastfeeding basics reviewed;Support Pillows;Position options;Skin to skin (encouraged upright football while mom recovering from C/S)  LATCH Score: 8 (LC assisted and observed, with more swallows after re-latch) - total feeding time was 15 minutes on (L)  Lactation Tools Discussed/Used   STS, cue feedings, hand expression, signs of proper latch and milk transfer Special considerations for (L) breast based on hx of radiation 10 years ago  Consult Status Consult Status: Follow-up Date: 08/08/14 Follow-up type: In-patient    Junious Dresser [facility] 08/07/2014, 8:08 PM

## 2014-08-07 NOTE — H&P (Signed)
Haley Strong is a 44 y.o. female presenting for cesarean section  44 yo G2P0010 @39 +1 presents for elective cesarean section. Pt has had some gestational dyspnea in the third trimester. Work up has been negative for any pathologic causes.   History OB History    Gravida Para Term Preterm AB TAB SAB Ectopic Multiple Living   1              Past Medical History  Diagnosis Date  . 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 Gestational dyspnea in third trimester     uses inhaler as needed   History reviewed. No pertinent past surgical history. Family History: family history includes Cancer in her mother. Social History:  reports that she has never smoked. She has never used smokeless tobacco. She reports that she does not drink alcohol or use illicit drugs.   Prenatal Transfer Tool  Maternal Diabetes: No Genetic Screening: Normal Maternal Ultrasounds/Referrals: Normal Fetal Ultrasounds or other Referrals:  None Maternal Substance Abuse:  No Significant Maternal Medications:  None Significant Maternal Lab Results:  None Other Comments:  None  ROS: as above    Blood pressure 124/72, pulse 101, temperature 98.1 F (36.7 C), temperature source Oral, resp. rate 18, SpO2 100 %. Exam Physical Exam  Prenatal labs: ABO, Rh: --/--/O POS (02/11 0830) Antibody: NEG (02/11 0830) Rubella: Immune (08/21 0000) RPR:   Non-reactive HBsAg: Negative (08/21 0000)  HIV: Non-reactive (08/21 0000)  GBS:   Negative  Assessment/Plan: 1) Admit 2) Ancef to OR 3) SCDs for DVT prophylaxis 4) Pt has been extensively counseled on the risks of elective cesarean section vs trial of labor. R/B/A reviewed at length. After careful consideration she wishes to proceed with cesarean. Informed consent obtained   Haley Strong H. 08/07/2014, 9:38 AM

## 2014-08-07 NOTE — Transfer of Care (Signed)
Immediate Anesthesia Transfer of Care Note  Patient: Haley Strong  Procedure(s) Performed: Procedure(s): CESAREAN SECTION (N/A)  Patient Location: PACU  Anesthesia Type:Epidural  Level of Consciousness: awake, alert  and oriented  Airway & Oxygen Therapy: Patient Spontanous Breathing  Post-op Assessment: Report given to RN and Post -op Vital signs reviewed and stable  Post vital signs: Reviewed and stable  Last Vitals:  Filed Vitals:   08/07/14 0837  BP: 124/72  Pulse: 101  Temp: 36.7 C  Resp: 18    Complications: No apparent anesthesia complications

## 2014-08-07 NOTE — Anesthesia Postprocedure Evaluation (Signed)
Anesthesia Post Note  Patient: Haley Strong  Procedure(s) Performed: Procedure(s) (LRB): CESAREAN SECTION (N/A)  Anesthesia type: Epidural  Patient location: PACU  Post pain: Pain level controlled  Post assessment: Post-op Vital signs reviewed  Last Vitals:  Filed Vitals:   08/07/14 1215  BP: 95/41  Pulse: 74  Temp:   Resp: 18    Post vital signs: Reviewed  Level of consciousness: awake  Complications: No apparent anesthesia complications

## 2014-08-07 NOTE — Consult Note (Signed)
Neonatology Note:   Attendance at C-section:    I was asked by Dr. Harrington Challenger to attend this primary elective C/S at 41 weeks due to maternal dyspnea. The mother is a G1P0 O pos, GBS neg with an uncomplicated pregnancy. ROM at delivery, fluid clear. Infant vigorous with good spontaneous cry and tone. Needed only minimal bulb suctioning. Ap 9/9. Lungs clear to ausc in DR. To CN to care of Pediatrician.   Real Cons, MD

## 2014-08-07 NOTE — Anesthesia Preprocedure Evaluation (Signed)
Anesthesia Evaluation  Patient identified by MRN, date of birth, ID band Patient awake    Reviewed: Allergy & Precautions, H&P , Patient's Chart, lab work & pertinent test results  Airway Mallampati: II  TM Distance: >3 FB Neck ROM: full    Dental   Pulmonary  breath sounds clear to auscultation        Cardiovascular Rhythm:regular Rate:Normal     Neuro/Psych    GI/Hepatic   Endo/Other    Renal/GU      Musculoskeletal   Abdominal   Peds  Hematology   Anesthesia Other Findings Gestational dyspnea 2/2 scarred lungs from pneumonia  Reproductive/Obstetrics (+) Pregnancy                             Anesthesia Physical Anesthesia Plan  ASA: III  Anesthesia Plan: Epidural   Post-op Pain Management:    Induction:   Airway Management Planned:   Additional Equipment:   Intra-op Plan:   Post-operative Plan:   Informed Consent: I have reviewed the patients History and Physical, chart, labs and discussed the procedure including the risks, benefits and alternatives for the proposed anesthesia with the patient or authorized representative who has indicated his/her understanding and acceptance.     Plan Discussed with:   Anesthesia Plan Comments:         Anesthesia Quick Evaluation

## 2014-08-07 NOTE — Op Note (Addendum)
Operative Report     Date: 08/07/2014 Time: 11:28 AM  Pre-Operative Diagnosis: 1) 39+1 week intrauterine pregnancy 2) Advanced maternal age 44) Desired elective cesarean 4) Uterine fibroids  Postoperative Diagnosis: same  Procedure: Primary low transverse cesarean section via Pfannenstiel skin incision  Surgeon: Dr. Vanessa Kick  Assistant: Gaylord Shih, RN  Anesthesia: Epidural and 1.3% Exparel diluted in 30 cc of 0.25% Marcaine  Operative Findings: vigorous female infant in the occiput posterior presentation with Apgar scores of 9 at 1 minute and 9 at 9 minutes weighing 7 lbs. 10 oz., 3470 g. Three pedunculated uterine fibroids were noted, 1) posterior fundal pedunculated 9cm 2) left posterior fundal pedunculated 3 cm 3) right posterior fundal pedunculated 3cm. Normal-appearing left ovary, normal-appearing right ovary with 2-3 cm simple cyst. Specimen: placenta for disposal  EBL: Total I/O In: 3300 [I.V.:3300] Out: 1100 [Urine:300; Blood:800]   Procedure: Ms. Crandell is an 44 year old gravida 2 para 0010 at 6 weeks and 1 days estimated gestational age who presents for cesarean section. The patient desires an elective primary cesarean section without a trial of labor. An extensive discussion of the risks, benefits, and alternatives of primary cesarean section without labor versus a trial of labor were discussed with the patient at length. These included, but were not limited to surgical risks of bleeding, infection, damage to internal organs, development of scar tissue, potential thromboembolic phenomena.  After careful consideration the patient wished to proceed with cesarean delivery. Following the appropriate informed consent the patient was brought to the operating room where spinal anesthesia was administered and found to be adequate. The patient was appropriately identified during a timeout procedure. She was placed in the dorsal supine position with a leftward tilt. She was prepped  and draped in the normal sterile fashion. Scalpel was then used to make a Pfannenstiel skin incision which was carried down to the underlying layers of soft tissue to the fascia. The fascia was incised in the midline and the fascial incision was extended laterally with Mayo scissors. The superior aspect of the fascial incision was grasped with Coker clamps x2, tented up and the rectus muscles dissected off sharply with the electrocautery unit area and the same procedure was repeated on the inferior aspect of the fascial incision. The rectus muscles were separated in the midline. The abdominal peritoneum was identified, tented up, entered sharply, and the incision was extended superiorly and inferiorly with good visualization of the bladder. The Alexis retractor was then deployed. The vesicouterine peritoneum was identified, tented up, entered sharply, and the bladder flap was created digitally. The scalpel was then used to make a low transverse incision on the uterus which was extended laterally with blunt dissection. Amniotomy was performed for clear fluid. The fetal vertex was identified, delivered easily through the uterine incision followed by the body. The infant cried vigorously on the operative field. Cord clamping was delayed for approximately 1 minute after delivery. The cord was then clamped and cut and the infant was passed to the awaiting neonatal team. The placenta was then delivered spontaneously, the uterus was cleared of all clot and debris. The uterine incision was repaired with #1 chromic in running locked fashion followed by a second imbricating layer.The ovaries, tubes, and uterus were inspected and the above findings were noted. The Alexis retractor was removed. The abdominal peritoneum was reapproximated with 2-0 Vicryl in a running fashion, the rectus muscles was reapproximated with 2-0 chromic in a running fashion. The fascia was closed with a looped PDS in  a running fashion. The subcutaneous  tissue was infiltrated with 1.3% Exparel diluted in 30 cc of 0.25% Marcaine. The skin was closed with 4-0 vicryl in a subcuticular fashion and surgical skin glue. All sponge lap and needle counts were correct x3. Patient tolerated the procedure well and recovered in stable condition following the procedure.

## 2014-08-08 LAB — CBC
HCT: 25.6 % — ABNORMAL LOW (ref 36.0–46.0)
Hemoglobin: 8.9 g/dL — ABNORMAL LOW (ref 12.0–15.0)
MCH: 31.6 pg (ref 26.0–34.0)
MCHC: 34.8 g/dL (ref 30.0–36.0)
MCV: 90.8 fL (ref 78.0–100.0)
PLATELETS: 109 10*3/uL — AB (ref 150–400)
RBC: 2.82 MIL/uL — ABNORMAL LOW (ref 3.87–5.11)
RDW: 14.5 % (ref 11.5–15.5)
WBC: 11.9 10*3/uL — ABNORMAL HIGH (ref 4.0–10.5)

## 2014-08-08 LAB — RPR: RPR: NONREACTIVE

## 2014-08-08 NOTE — Addendum Note (Signed)
Addendum  created 08/08/14 0800 by Garner Nash, CRNA   Modules edited: Notes Section   Notes Section:  File: [id number]

## 2014-08-08 NOTE — Anesthesia Postprocedure Evaluation (Signed)
  Anesthesia Post-op Note  Patient: Haley Strong  Procedure(s) Performed: Procedure(s): CESAREAN SECTION (N/A)  Patient Location: Mother/Baby  Anesthesia Type:Epidural  Level of Consciousness: awake and alert   Airway and Oxygen Therapy: Patient Spontanous Breathing  Post-op Pain: mild  Post-op Assessment: Post-op Vital signs reviewed, Patient's Cardiovascular Status Stable, Respiratory Function Stable, No signs of Nausea or vomiting, Pain level controlled, No headache, No residual numbness and No residual motor weakness  Post-op Vital Signs: Reviewed  Last Vitals:  Filed Vitals:   08/08/14 0500  BP: 94/45  Pulse: 58  Temp: 36.6 C  Resp: 18    Complications: No apparent anesthesia complications

## 2014-08-08 NOTE — Lactation Note (Signed)
This note was copied from the chart of [place]. Lactation Consultation Note: Follow up visit with mom. She reports she is having some trouble getting the baby latched and his hands are in the way. Reassurance given.Assisted mom in football hold. Basic teaching done. Reviewed cluster feeding and encouraged to take a nap this afternoon.  Encouragement given. No further questions at present. To call for assist prn  Patient Name: Haley Strong [id number] Date: 08/08/2014 Reason for consult: Follow-up assessment   Maternal Data    Feeding Feeding Type: Breast Fed Length of feed: 15 min  LATCH Score/Interventions Latch: Grasps breast easily, tongue down, lips flanged, rhythmical sucking.  Audible Swallowing: A few with stimulation Intervention(s): Hand expression  Type of Nipple: Everted at rest and after stimulation  Comfort (Breast/Nipple): Soft / non-tender     Hold (Positioning): Assistance needed to correctly position infant at breast and maintain latch. Intervention(s): Breastfeeding basics reviewed;Position options;Skin to skin;Support Pillows  LATCH Score: 8  Lactation Tools Discussed/Used     Consult Status Consult Status: Follow-up Date: 08/09/14 Follow-up type: In-patient    Truddie Crumble 08/08/2014, 2:06 PM

## 2014-08-09 ENCOUNTER — Encounter (HOSPITAL_COMMUNITY): Payer: Self-pay | Admitting: Obstetrics and Gynecology

## 2014-08-09 MED ORDER — OXYCODONE-ACETAMINOPHEN 5-325 MG PO TABS: 2.0000 | ORAL_TABLET | ORAL | Status: DC | PRN

## 2014-08-09 NOTE — Progress Notes (Signed)
POD#2 Pt is doing well. She would like to be discharged. VSSAF IMP/ Stable Plan/Will discharge

## 2014-08-09 NOTE — Discharge Summary (Signed)
Obstetric Discharge Summary Reason for Admission: cesarean section Prenatal Procedures: ultrasound Intrapartum Procedures: cesarean: low cervical, transverse Postpartum Procedures: none Complications-Operative and Postpartum: none HEMOGLOBIN  Date Value Ref Range Status  08/08/2014 8.9* 12.0 - 15.0 g/dL Final    Comment:    DELTA CHECK NOTED REPEATED TO VERIFY    HCT  Date Value Ref Range Status  08/08/2014 25.6* 36.0 - 46.0 % Final    Physical Exam:  General: alert Lochia: appropriate Uterine Fundus: firm Incision: healing well DVT Evaluation: No evidence of DVT seen on physical exam.  Discharge Diagnoses: Term Pregnancy-delivered  Discharge Information: Date: 08/09/2014 Activity: pelvic rest Diet: routine Medications: PNV, Ibuprofen and Percocet Condition: stable Instructions: refer to practice specific booklet Discharge to: home Follow-up Information    Follow up with Marcial Pacas., MD. Schedule an appointment as soon as possible for a visit in 1 month.   Specialty:  Obstetrics and Gynecology   Contact information:   [place] [address] [phone]       Newborn Data: Live born female  Birth Weight: 7 lb 10.4 oz (3470 g) APGAR: 9, 9  Home with mother.  Haley Strong 08/09/2014, 10:52 AM

## 2014-08-09 NOTE — Lactation Note (Signed)
This note was copied from the chart of [place]. Lactation Consultation Note  Follow up visit made.  Baby just returned from circumcision and sleepy.  Mom states baby is latching and nursing well.  He prefers left breast.  Teaching done including engorgement treatment.  Questions answered.  Outpatient services and support encouraged.  Patient Name: Boy Oriah Leinweber FXOVA'N Date: 08/09/2014     Maternal Data    Feeding Feeding Type: Breast Fed Length of feed: 10 min  LATCH Score/Interventions                      Lactation Tools Discussed/Used     Consult Status      Ave Filter 08/09/2014, 12:23 PM

## 2015-10-20 LAB — OB RESULTS CONSOLE GBS: STREP GROUP B AG: NEGATIVE

## 2016-03-19 IMAGING — DX DG CHEST 2V
2 series · 2 of 2 positions shown · non-contrast
Comparison: None.

CLINICAL DATA: Pregnant, tachypnea

EXAM:
CHEST  2 VIEW

[chest pa]
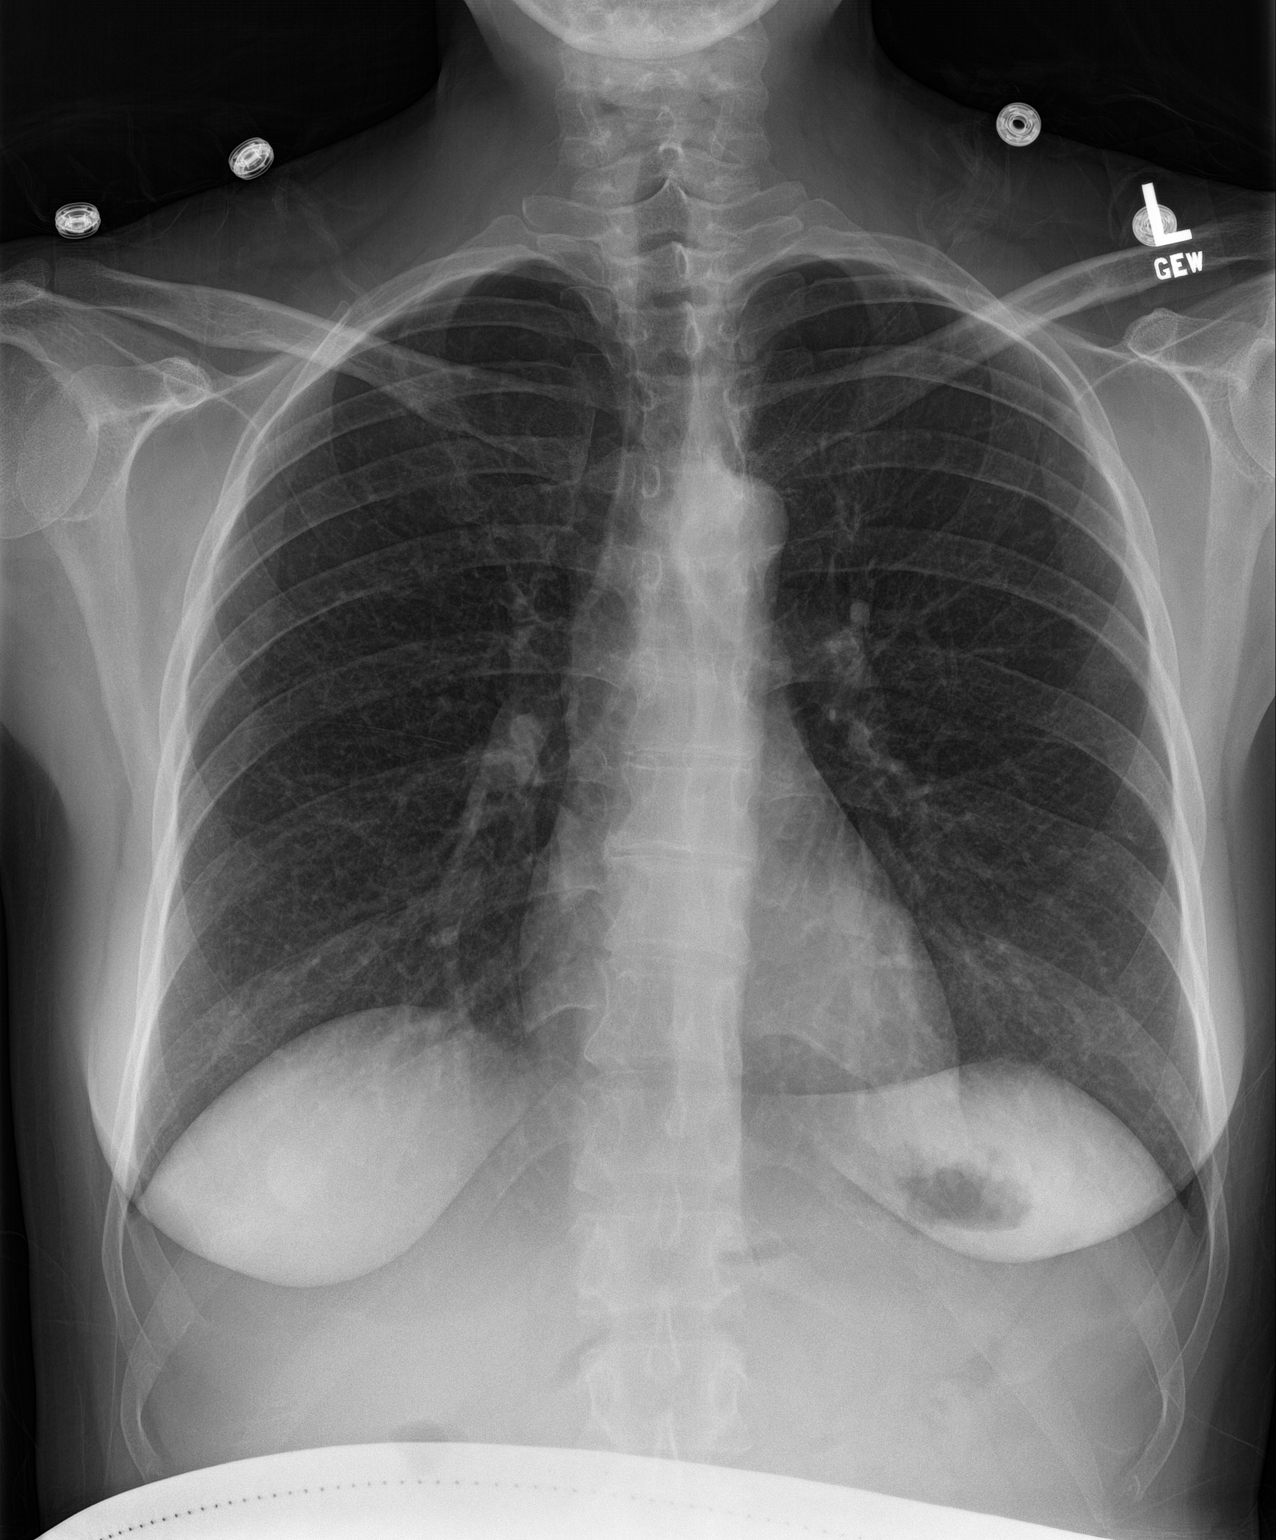

[chest lat]
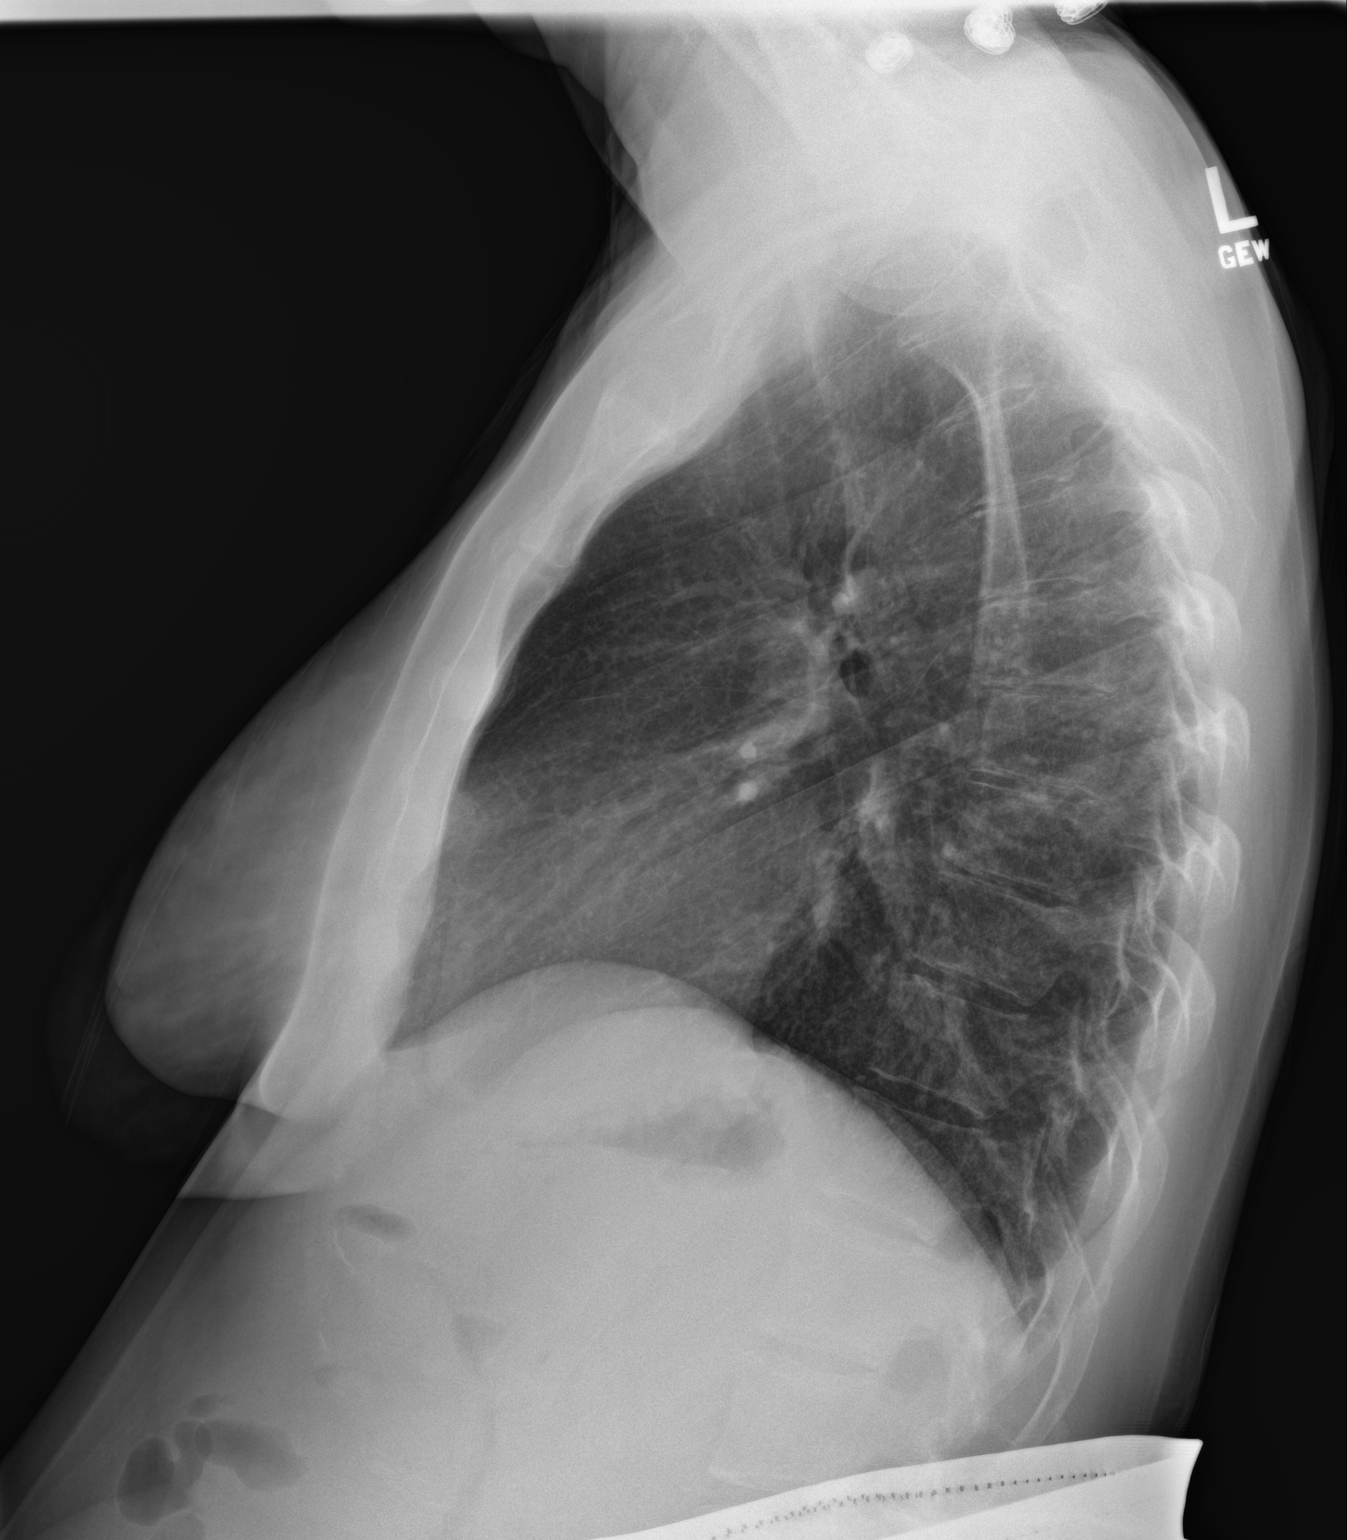

[2 of 2 positions shown; findings below may reference images not displayed]

FINDINGS: Lungs are clear.  No pleural effusion or pneumothorax.

The heart is normal in size.

Visualized osseous structures are within normal limits.
IMPRESSION: Normal chest radiographs.

## 2016-05-25 LAB — OB RESULTS CONSOLE RPR: RPR: NONREACTIVE

## 2016-05-25 LAB — OB RESULTS CONSOLE RUBELLA ANTIBODY, IGM: RUBELLA: IMMUNE

## 2016-05-25 LAB — OB RESULTS CONSOLE GC/CHLAMYDIA
Chlamydia: NEGATIVE
Gonorrhea: NEGATIVE

## 2016-05-25 LAB — OB RESULTS CONSOLE HIV ANTIBODY (ROUTINE TESTING): HIV: NONREACTIVE

## 2016-05-25 LAB — OB RESULTS CONSOLE HEPATITIS B SURFACE ANTIGEN: Hepatitis B Surface Ag: NEGATIVE

## 2016-06-24 LAB — OB RESULTS CONSOLE GC/CHLAMYDIA
CHLAMYDIA, DNA PROBE: NEGATIVE
GC PROBE AMP, GENITAL: NEGATIVE

## 2016-06-24 LAB — OB RESULTS CONSOLE HEPATITIS B SURFACE ANTIGEN: HEP B S AG: NEGATIVE

## 2016-06-24 LAB — OB RESULTS CONSOLE RPR: RPR: NONREACTIVE

## 2016-06-24 LAB — OB RESULTS CONSOLE ABO/RH: RH Type: POSITIVE

## 2016-06-24 LAB — OB RESULTS CONSOLE ANTIBODY SCREEN: Antibody Screen: NEGATIVE

## 2016-06-24 LAB — OB RESULTS CONSOLE RUBELLA ANTIBODY, IGM: Rubella: IMMUNE

## 2016-06-24 LAB — OB RESULTS CONSOLE HIV ANTIBODY (ROUTINE TESTING): HIV: NONREACTIVE

## 2016-07-01 ENCOUNTER — Other Ambulatory Visit (HOSPITAL_COMMUNITY): Payer: Self-pay | Admitting: Obstetrics and Gynecology

## 2016-07-01 DIAGNOSIS — O4402 Placenta previa specified as without hemorrhage, second trimester: Secondary | ICD-10-CM

## 2016-07-11 ENCOUNTER — Other Ambulatory Visit (HOSPITAL_COMMUNITY): Payer: Self-pay | Admitting: Obstetrics and Gynecology

## 2016-07-11 ENCOUNTER — Encounter (HOSPITAL_COMMUNITY): Payer: Self-pay | Admitting: Obstetrics and Gynecology

## 2016-07-11 DIAGNOSIS — Z3689 Encounter for other specified antenatal screening: Secondary | ICD-10-CM

## 2016-07-11 DIAGNOSIS — Z3A23 23 weeks gestation of pregnancy: Secondary | ICD-10-CM

## 2016-07-28 ENCOUNTER — Encounter (HOSPITAL_COMMUNITY): Payer: Self-pay | Admitting: *Deleted

## 2016-07-29 ENCOUNTER — Other Ambulatory Visit: Payer: Self-pay

## 2016-07-29 ENCOUNTER — Ambulatory Visit (HOSPITAL_COMMUNITY): Admission: RE | Admit: 2016-07-29 | Payer: 59 | Source: Ambulatory Visit

## 2016-07-29 ENCOUNTER — Ambulatory Visit (HOSPITAL_COMMUNITY)
Admission: RE | Admit: 2016-07-29 | Discharge: 2016-07-29 | Disposition: A | Discharge status: Home / Self Care | Payer: 59 | Source: Ambulatory Visit | Attending: Obstetrics and Gynecology | Admitting: Obstetrics and Gynecology

## 2016-07-29 ENCOUNTER — Encounter (HOSPITAL_COMMUNITY): Payer: Self-pay

## 2016-07-29 ENCOUNTER — Other Ambulatory Visit (HOSPITAL_COMMUNITY): Payer: Self-pay | Admitting: Obstetrics and Gynecology

## 2016-07-29 DIAGNOSIS — Z3A23 23 weeks gestation of pregnancy: Secondary | ICD-10-CM

## 2016-07-29 DIAGNOSIS — O43103 Malformation of placenta, unspecified, third trimester: Secondary | ICD-10-CM | POA: Insufficient documentation

## 2016-07-29 DIAGNOSIS — O09522 Supervision of elderly multigravida, second trimester: Secondary | ICD-10-CM | POA: Insufficient documentation

## 2016-07-29 DIAGNOSIS — Z3689 Encounter for other specified antenatal screening: Secondary | ICD-10-CM | POA: Insufficient documentation

## 2016-07-29 DIAGNOSIS — O09812 Supervision of pregnancy resulting from assisted reproductive technology, second trimester: Secondary | ICD-10-CM | POA: Insufficient documentation

## 2016-09-27 ENCOUNTER — Other Ambulatory Visit: Payer: Self-pay | Admitting: Obstetrics and Gynecology

## 2016-10-22 LAB — OB RESULTS CONSOLE GBS: GBS: NEGATIVE

## 2016-11-07 ENCOUNTER — Encounter (HOSPITAL_COMMUNITY): Payer: Self-pay

## 2016-11-11 ENCOUNTER — Encounter (HOSPITAL_COMMUNITY)
Admission: RE | Admit: 2016-11-11 | Discharge: 2016-11-11 | Disposition: A | Discharge status: Home / Self Care | Payer: 59 | Source: Ambulatory Visit | Attending: Obstetrics and Gynecology | Admitting: Obstetrics and Gynecology

## 2016-11-11 HISTORY — DX: Benign neoplasm of connective and other soft tissue, unspecified: D21.9

## 2016-11-11 HISTORY — DX: Unspecified injury of lung, unspecified, initial encounter: S27.309A

## 2016-11-11 HISTORY — DX: Supervision of elderly multigravida, unspecified trimester: O09.529

## 2016-11-11 LAB — CBC
HEMATOCRIT: 37.3 % (ref 36.0–46.0)
Hemoglobin: 12.8 g/dL (ref 12.0–15.0)
MCH: 30.8 pg (ref 26.0–34.0)
MCHC: 34.3 g/dL (ref 30.0–36.0)
MCV: 89.9 fL (ref 78.0–100.0)
Platelets: 147 10*3/uL — ABNORMAL LOW (ref 150–400)
RBC: 4.15 MIL/uL (ref 3.87–5.11)
RDW: 13.9 % (ref 11.5–15.5)
WBC: 9.8 10*3/uL (ref 4.0–10.5)

## 2016-11-11 LAB — TYPE AND SCREEN
ABO/RH(D): O POS
ANTIBODY SCREEN: NEGATIVE

## 2016-11-11 NOTE — Patient Instructions (Signed)
[place]  11/11/2016   Your procedure is scheduled on:  11/14/2016  Enter through the Main Entrance of [facility] [place] up the phone at the desk and dial [phone].   Call this number if you have problems the morning of surgery: [phone]   Remember:   Do not eat food:After Midnight.  Do not drink clear liquids: After Midnight.  Take these medicines the morning of surgery with A SIP OF WATER: NONE   Do not wear jewelry, make-up or nail polish.  Do not wear lotions, powders, or perfumes. Do not wear deodorant.  Do not shave 48 hours prior to surgery.  Do not bring valuables to the hospital.  [facility] is not   responsible for any belongings or valuables brought to the hospital.  Contacts, dentures or bridgework may not be worn into surgery.  Leave suitcase in the car. After surgery it may be brought to your room.  For patients admitted to the hospital, checkout time is 11:00 AM the day of              discharge.   Patients discharged the day of surgery will not be allowed to drive             home.  Name and phone number of your driver: NA  Special Instructions:   N/A   Please read over the following fact sheets that you were given:   Surgical Site Infection Prevention

## 2016-11-12 LAB — RPR: RPR Ser Ql: NONREACTIVE

## 2016-11-14 ENCOUNTER — Inpatient Hospital Stay (HOSPITAL_COMMUNITY): Payer: 59 | Admitting: Anesthesiology

## 2016-11-14 ENCOUNTER — Encounter (HOSPITAL_COMMUNITY): Payer: Self-pay | Admitting: Obstetrics

## 2016-11-14 ENCOUNTER — Encounter (HOSPITAL_COMMUNITY)
Admission: RE | Disposition: A | Discharge status: Home / Self Care | Payer: Self-pay | Source: Ambulatory Visit | Attending: Obstetrics and Gynecology

## 2016-11-14 ENCOUNTER — Inpatient Hospital Stay (HOSPITAL_COMMUNITY)
Admission: RE | Admit: 2016-11-14 | Discharge: 2016-11-16 | DRG: 766 | Disposition: A | Discharge status: Home / Self Care | Payer: 59 | Source: Ambulatory Visit | Attending: Obstetrics and Gynecology | Admitting: Obstetrics and Gynecology

## 2016-11-14 DIAGNOSIS — O321XX0 Maternal care for breech presentation, not applicable or unspecified: Secondary | ICD-10-CM | POA: Diagnosis present

## 2016-11-14 DIAGNOSIS — Z3A39 39 weeks gestation of pregnancy: Secondary | ICD-10-CM | POA: Diagnosis not present

## 2016-11-14 DIAGNOSIS — O34211 Maternal care for low transverse scar from previous cesarean delivery: Principal | ICD-10-CM | POA: Diagnosis present

## 2016-11-14 HISTORY — DX: Unspecified asthma, uncomplicated: J45.909

## 2016-11-14 LAB — TYPE AND SCREEN
ABO/RH(D): O POS
ANTIBODY SCREEN: NEGATIVE

## 2016-11-14 SURGERY — Surgical Case
Anesthesia: Epidural | Site: Abdomen | Wound class: Clean Contaminated

## 2016-11-14 MED ORDER — LACTATED RINGERS IV SOLN
INTRAVENOUS | Status: DC
  Administered 2016-11-14: 11:00:00 via INTRAVENOUS

## 2016-11-14 MED ORDER — PROMETHAZINE HCL 25 MG/ML IJ SOLN: 6.2500 mg | INTRAMUSCULAR | Status: DC | PRN

## 2016-11-14 MED ORDER — SODIUM CHLORIDE 0.9 % IR SOLN
Status: DC | PRN
  Administered 2016-11-14: 1

## 2016-11-14 MED ORDER — NALBUPHINE HCL 10 MG/ML IJ SOLN
5.0000 mg | Freq: Once | INTRAMUSCULAR | Status: AC | PRN
  Administered 2016-11-14: 5 mg via INTRAVENOUS

## 2016-11-14 MED ORDER — BUPIVACAINE IN DEXTROSE 0.75-8.25 % IT SOLN
INTRATHECAL | Status: AC
  Filled 2016-11-14: qty 2

## 2016-11-14 MED ORDER — DIPHENHYDRAMINE HCL 25 MG PO CAPS
25.0000 mg | ORAL_CAPSULE | Freq: Four times a day (QID) | ORAL | Status: DC | PRN
Start: 2016-11-14 — End: 2016-11-16
  Administered 2016-11-14 – 2016-11-15 (×3): 25 mg via ORAL
  Filled 2016-11-14 (×3): qty 1

## 2016-11-14 MED ORDER — METHYLERGONOVINE MALEATE 0.2 MG PO TABS: 0.2000 mg | ORAL_TABLET | ORAL | Status: DC | PRN

## 2016-11-14 MED ORDER — SOD CITRATE-CITRIC ACID 500-334 MG/5ML PO SOLN
30.0000 mL | Freq: Once | ORAL | Status: AC
  Administered 2016-11-14: 30 mL via ORAL
  Filled 2016-11-14: qty 15

## 2016-11-14 MED ORDER — ONDANSETRON HCL 4 MG/2ML IJ SOLN
INTRAMUSCULAR | Status: AC
  Filled 2016-11-14: qty 2

## 2016-11-14 MED ORDER — PHENYLEPHRINE 8 MG IN D5W 100 ML (0.08MG/ML) PREMIX OPTIME
INJECTION | INTRAVENOUS | Status: AC
  Filled 2016-11-14: qty 100

## 2016-11-14 MED ORDER — BUPIVACAINE IN DEXTROSE 0.75-8.25 % IT SOLN
INTRATHECAL | Status: DC | PRN
  Administered 2016-11-14: 1.6 mL via INTRATHECAL

## 2016-11-14 MED ORDER — IBUPROFEN 600 MG PO TABS
600.0000 mg | ORAL_TABLET | Freq: Four times a day (QID) | ORAL | Status: DC
  Administered 2016-11-14 – 2016-11-16 (×8): 600 mg via ORAL
  Filled 2016-11-14 (×8): qty 1

## 2016-11-14 MED ORDER — MEPERIDINE HCL 25 MG/ML IJ SOLN
INTRAMUSCULAR | Status: DC | PRN
  Administered 2016-11-14: 25 mg via INTRAVENOUS

## 2016-11-14 MED ORDER — FENTANYL CITRATE (PF) 100 MCG/2ML IJ SOLN
INTRAMUSCULAR | Status: AC
  Filled 2016-11-14: qty 2

## 2016-11-14 MED ORDER — OXYTOCIN 10 UNIT/ML IJ SOLN
INTRAVENOUS | Status: DC | PRN
  Administered 2016-11-14: 40 [IU] via INTRAVENOUS

## 2016-11-14 MED ORDER — OXYTOCIN 40 UNITS IN LACTATED RINGERS INFUSION - SIMPLE MED: 2.5000 [IU]/h | INTRAVENOUS | Status: AC

## 2016-11-14 MED ORDER — NALBUPHINE HCL 10 MG/ML IJ SOLN: 5.0000 mg | INTRAMUSCULAR | Status: DC | PRN

## 2016-11-14 MED ORDER — OXYCODONE HCL 5 MG/5ML PO SOLN: 5.0000 mg | Freq: Once | ORAL | Status: DC | PRN

## 2016-11-14 MED ORDER — SCOPOLAMINE 1 MG/3DAYS TD PT72
1.0000 | MEDICATED_PATCH | Freq: Once | TRANSDERMAL | Status: DC
  Administered 2016-11-14: 1.5 mg via TRANSDERMAL
  Filled 2016-11-14: qty 1

## 2016-11-14 MED ORDER — MORPHINE SULFATE (PF) 0.5 MG/ML IJ SOLN
INTRAMUSCULAR | Status: DC | PRN
  Administered 2016-11-14: .2 mg via INTRATHECAL

## 2016-11-14 MED ORDER — NALBUPHINE HCL 10 MG/ML IJ SOLN: 5.0000 mg | Freq: Once | INTRAMUSCULAR | Status: AC | PRN

## 2016-11-14 MED ORDER — ONDANSETRON HCL 4 MG/2ML IJ SOLN: 4.0000 mg | Freq: Three times a day (TID) | INTRAMUSCULAR | Status: DC | PRN

## 2016-11-14 MED ORDER — LACTATED RINGERS IV SOLN
INTRAVENOUS | Status: DC
  Administered 2016-11-14 – 2016-11-15 (×2): via INTRAVENOUS

## 2016-11-14 MED ORDER — MORPHINE SULFATE (PF) 0.5 MG/ML IJ SOLN
INTRAMUSCULAR | Status: AC
  Filled 2016-11-14: qty 10

## 2016-11-14 MED ORDER — WITCH HAZEL-GLYCERIN EX PADS: 1.0000 "application " | MEDICATED_PAD | CUTANEOUS | Status: DC | PRN

## 2016-11-14 MED ORDER — PRENATAL MULTIVITAMIN CH
1.0000 | ORAL_TABLET | Freq: Every day | ORAL | Status: DC
  Administered 2016-11-16: 1 via ORAL
  Filled 2016-11-14 (×2): qty 1

## 2016-11-14 MED ORDER — NALOXONE HCL 0.4 MG/ML IJ SOLN: 0.4000 mg | INTRAMUSCULAR | Status: DC | PRN

## 2016-11-14 MED ORDER — ACETAMINOPHEN 325 MG PO TABS
650.0000 mg | ORAL_TABLET | ORAL | Status: DC | PRN
  Administered 2016-11-14: 650 mg via ORAL
  Filled 2016-11-14: qty 2

## 2016-11-14 MED ORDER — ZOLPIDEM TARTRATE 5 MG PO TABS: 5.0000 mg | ORAL_TABLET | Freq: Every evening | ORAL | Status: DC | PRN

## 2016-11-14 MED ORDER — OXYTOCIN 10 UNIT/ML IJ SOLN
INTRAMUSCULAR | Status: AC
  Filled 2016-11-14: qty 4

## 2016-11-14 MED ORDER — METHYLERGONOVINE MALEATE 0.2 MG/ML IJ SOLN: 0.2000 mg | INTRAMUSCULAR | Status: DC | PRN

## 2016-11-14 MED ORDER — PHENYLEPHRINE 8 MG IN D5W 100 ML (0.08MG/ML) PREMIX OPTIME
INJECTION | INTRAVENOUS | Status: DC | PRN
  Administered 2016-11-14: 60 ug/min via INTRAVENOUS

## 2016-11-14 MED ORDER — MEPERIDINE HCL 25 MG/ML IJ SOLN: 6.2500 mg | INTRAMUSCULAR | Status: DC | PRN

## 2016-11-14 MED ORDER — OXYCODONE-ACETAMINOPHEN 5-325 MG PO TABS
2.0000 | ORAL_TABLET | ORAL | Status: DC | PRN
  Administered 2016-11-15 – 2016-11-16 (×2): 2 via ORAL
  Filled 2016-11-14 (×2): qty 2

## 2016-11-14 MED ORDER — LACTATED RINGERS IV SOLN
INTRAVENOUS | Status: DC
  Administered 2016-11-14 (×2): via INTRAVENOUS

## 2016-11-14 MED ORDER — KETOROLAC TROMETHAMINE 30 MG/ML IJ SOLN: 30.0000 mg | Freq: Four times a day (QID) | INTRAMUSCULAR | Status: DC | PRN

## 2016-11-14 MED ORDER — MEPERIDINE HCL 25 MG/ML IJ SOLN
INTRAMUSCULAR | Status: AC
  Filled 2016-11-14: qty 1

## 2016-11-14 MED ORDER — PHENYLEPHRINE HCL 10 MG/ML IJ SOLN
INTRAMUSCULAR | Status: DC | PRN
  Administered 2016-11-14 (×3): 80 ug via INTRAVENOUS

## 2016-11-14 MED ORDER — OXYCODONE-ACETAMINOPHEN 5-325 MG PO TABS
1.0000 | ORAL_TABLET | ORAL | Status: DC | PRN
  Administered 2016-11-14 – 2016-11-16 (×3): 1 via ORAL
  Filled 2016-11-14 (×3): qty 1

## 2016-11-14 MED ORDER — NALOXONE HCL 2 MG/2ML IJ SOSY
1.0000 ug/kg/h | PREFILLED_SYRINGE | INTRAMUSCULAR | Status: DC | PRN
  Filled 2016-11-14: qty 2

## 2016-11-14 MED ORDER — TETANUS-DIPHTH-ACELL PERTUSSIS 5-2.5-18.5 LF-MCG/0.5 IM SUSP: 0.5000 mL | Freq: Once | INTRAMUSCULAR | Status: DC

## 2016-11-14 MED ORDER — MENTHOL 3 MG MT LOZG: 1.0000 | LOZENGE | OROMUCOSAL | Status: DC | PRN

## 2016-11-14 MED ORDER — SODIUM CHLORIDE 0.9% FLUSH: 3.0000 mL | INTRAVENOUS | Status: DC | PRN

## 2016-11-14 MED ORDER — HYDROMORPHONE HCL 1 MG/ML IJ SOLN
INTRAMUSCULAR | Status: AC
  Administered 2016-11-14: 0.25 mg via INTRAVENOUS
  Filled 2016-11-14: qty 1

## 2016-11-14 MED ORDER — KETOROLAC TROMETHAMINE 30 MG/ML IJ SOLN
INTRAMUSCULAR | Status: AC
  Administered 2016-11-14: 30 mg via INTRAMUSCULAR
  Filled 2016-11-14: qty 1

## 2016-11-14 MED ORDER — NALBUPHINE HCL 10 MG/ML IJ SOLN
INTRAMUSCULAR | Status: AC
  Administered 2016-11-14: 5 mg via INTRAVENOUS
  Filled 2016-11-14: qty 1

## 2016-11-14 MED ORDER — KETOROLAC TROMETHAMINE 30 MG/ML IJ SOLN
30.0000 mg | Freq: Once | INTRAMUSCULAR | Status: AC
  Administered 2016-11-14: 30 mg via INTRAMUSCULAR

## 2016-11-14 MED ORDER — FENTANYL CITRATE (PF) 100 MCG/2ML IJ SOLN
INTRAMUSCULAR | Status: DC | PRN
  Administered 2016-11-14: 10 ug via INTRATHECAL

## 2016-11-14 MED ORDER — SIMETHICONE 80 MG PO CHEW
80.0000 mg | CHEWABLE_TABLET | ORAL | Status: DC
  Administered 2016-11-14 – 2016-11-16 (×2): 80 mg via ORAL
  Filled 2016-11-14 (×2): qty 1

## 2016-11-14 MED ORDER — COCONUT OIL OIL
1.0000 | TOPICAL_OIL | Status: DC | PRN
Start: 2016-11-14 — End: 2016-11-16
  Administered 2016-11-16: 1 via TOPICAL
  Filled 2016-11-14: qty 120

## 2016-11-14 MED ORDER — SCOPOLAMINE 1 MG/3DAYS TD PT72: 1.0000 | MEDICATED_PATCH | Freq: Once | TRANSDERMAL | Status: DC

## 2016-11-14 MED ORDER — ALBUTEROL SULFATE (2.5 MG/3ML) 0.083% IN NEBU: 3.0000 mL | INHALATION_SOLUTION | RESPIRATORY_TRACT | Status: DC | PRN

## 2016-11-14 MED ORDER — CEFAZOLIN SODIUM-DEXTROSE 2-4 GM/100ML-% IV SOLN
2.0000 g | INTRAVENOUS | Status: AC
  Administered 2016-11-14: 2 g via INTRAVENOUS
  Filled 2016-11-14: qty 100

## 2016-11-14 MED ORDER — NALBUPHINE HCL 10 MG/ML IJ SOLN
5.0000 mg | INTRAMUSCULAR | Status: DC | PRN
  Administered 2016-11-14 (×2): 5 mg via INTRAVENOUS
  Filled 2016-11-14 (×2): qty 1

## 2016-11-14 MED ORDER — SIMETHICONE 80 MG PO CHEW: 80.0000 mg | CHEWABLE_TABLET | ORAL | Status: DC | PRN

## 2016-11-14 MED ORDER — DIPHENHYDRAMINE HCL 50 MG/ML IJ SOLN
12.5000 mg | INTRAMUSCULAR | Status: DC | PRN
  Administered 2016-11-14: 12.5 mg via INTRAVENOUS
  Filled 2016-11-14: qty 1

## 2016-11-14 MED ORDER — PHENYLEPHRINE 40 MCG/ML (10ML) SYRINGE FOR IV PUSH (FOR BLOOD PRESSURE SUPPORT)
PREFILLED_SYRINGE | INTRAVENOUS | Status: AC
  Filled 2016-11-14: qty 10

## 2016-11-14 MED ORDER — DIBUCAINE 1 % RE OINT: 1.0000 "application " | TOPICAL_OINTMENT | RECTAL | Status: DC | PRN

## 2016-11-14 MED ORDER — DIPHENHYDRAMINE HCL 25 MG PO CAPS: 25.0000 mg | ORAL_CAPSULE | ORAL | Status: DC | PRN

## 2016-11-14 MED ORDER — HYDROMORPHONE HCL 1 MG/ML IJ SOLN
0.2500 mg | INTRAMUSCULAR | Status: DC | PRN
  Administered 2016-11-14 (×3): 0.25 mg via INTRAVENOUS

## 2016-11-14 MED ORDER — OXYCODONE HCL 5 MG PO TABS: 5.0000 mg | ORAL_TABLET | Freq: Once | ORAL | Status: DC | PRN

## 2016-11-14 MED ORDER — SIMETHICONE 80 MG PO CHEW
80.0000 mg | CHEWABLE_TABLET | Freq: Three times a day (TID) | ORAL | Status: DC
  Administered 2016-11-15 – 2016-11-16 (×3): 80 mg via ORAL
  Filled 2016-11-14 (×3): qty 1

## 2016-11-14 MED ORDER — SENNOSIDES-DOCUSATE SODIUM 8.6-50 MG PO TABS
2.0000 | ORAL_TABLET | ORAL | Status: DC
  Administered 2016-11-14 – 2016-11-16 (×2): 2 via ORAL
  Filled 2016-11-14 (×2): qty 2

## 2016-11-14 SURGICAL SUPPLY — 35 items
CHLORAPREP W/TINT 26ML (MISCELLANEOUS) ×3 IMPLANT
CLAMP CORD UMBIL (MISCELLANEOUS) IMPLANT
CLOTH BEACON ORANGE TIMEOUT ST (SAFETY) ×3 IMPLANT
DERMABOND ADHESIVE PROPEN (GAUZE/BANDAGES/DRESSINGS) ×2
DERMABOND ADVANCED (GAUZE/BANDAGES/DRESSINGS)
DERMABOND ADVANCED .7 DNX12 (GAUZE/BANDAGES/DRESSINGS) IMPLANT
DERMABOND ADVANCED .7 DNX6 (GAUZE/BANDAGES/DRESSINGS) ×1 IMPLANT
DRSG OPSITE POSTOP 4X10 (GAUZE/BANDAGES/DRESSINGS) ×3 IMPLANT
DURAPREP 26ML APPLICATOR (WOUND CARE) ×3 IMPLANT
ELECT REM PT RETURN 9FT ADLT (ELECTROSURGICAL) ×3
ELECTRODE REM PT RTRN 9FT ADLT (ELECTROSURGICAL) ×1 IMPLANT
EXTRACTOR VACUUM M CUP 4 TUBE (SUCTIONS) IMPLANT
EXTRACTOR VACUUM M CUP 4' TUBE (SUCTIONS)
GLOVE BIO SURGEON STRL SZ7 (GLOVE) ×3 IMPLANT
GLOVE BIOGEL PI IND STRL 7.0 (GLOVE) ×1 IMPLANT
GLOVE BIOGEL PI INDICATOR 7.0 (GLOVE) ×2
GOWN STRL REUS W/TWL LRG LVL3 (GOWN DISPOSABLE) ×6 IMPLANT
KIT ABG SYR 3ML LUER SLIP (SYRINGE) IMPLANT
NEEDLE HYPO 22GX1.5 SAFETY (NEEDLE) IMPLANT
NEEDLE HYPO 25X5/8 SAFETYGLIDE (NEEDLE) IMPLANT
NS IRRIG 1000ML POUR BTL (IV SOLUTION) ×3 IMPLANT
PACK C SECTION WH (CUSTOM PROCEDURE TRAY) ×3 IMPLANT
PAD OB MATERNITY 4.3X12.25 (PERSONAL CARE ITEMS) ×3 IMPLANT
PENCIL SMOKE EVAC W/HOLSTER (ELECTROSURGICAL) ×3 IMPLANT
RTRCTR C-SECT PINK 25CM LRG (MISCELLANEOUS) ×3 IMPLANT
SUT CHROMIC 1 CTX 36 (SUTURE) ×6 IMPLANT
SUT CHROMIC 2 0 CT 1 (SUTURE) ×3 IMPLANT
SUT PDS AB 0 CTX 60 (SUTURE) ×3 IMPLANT
SUT VIC AB 2-0 CT1 27 (SUTURE) ×2
SUT VIC AB 2-0 CT1 TAPERPNT 27 (SUTURE) ×1 IMPLANT
SUT VIC AB 4-0 KS 27 (SUTURE) IMPLANT
SYR 30ML LL (SYRINGE) IMPLANT
TOWEL OR 17X24 6PK STRL BLUE (TOWEL DISPOSABLE) ×3 IMPLANT
TRAY FOLEY BAG SILVER LF 14FR (SET/KITS/TRAYS/PACK) ×3 IMPLANT
TRAY FOLEY CATH SILVER 16FR LF (SET/KITS/TRAYS/PACK) ×3 IMPLANT

## 2016-11-14 NOTE — Addendum Note (Signed)
Addendum  created 11/14/16 1621 by Raenette Rover, CRNA   Sign clinical note

## 2016-11-14 NOTE — Anesthesia Preprocedure Evaluation (Signed)
Anesthesia Evaluation  Patient identified by MRN, date of birth, ID band Patient awake    Reviewed: Allergy & Precautions, H&P , Patient's Chart, lab work & pertinent test results  Airway Mallampati: II  TM Distance: >3 FB Neck ROM: full    Dental   Pulmonary    breath sounds clear to auscultation       Cardiovascular  Rhythm:regular Rate:Normal     Neuro/Psych    GI/Hepatic   Endo/Other    Renal/GU      Musculoskeletal   Abdominal   Peds  Hematology   Anesthesia Other Findings Gestational dyspnea 2/2 scarred lungs from pneumonia  Reproductive/Obstetrics (+) Pregnancy                             Anesthesia Physical  Anesthesia Plan  ASA: II  Anesthesia Plan: Epidural   Post-op Pain Management:    Induction:   Airway Management Planned:   Additional Equipment:   Intra-op Plan:   Post-operative Plan:   Informed Consent: I have reviewed the patients History and Physical, chart, labs and discussed the procedure including the risks, benefits and alternatives for the proposed anesthesia with the patient or authorized representative who has indicated his/her understanding and acceptance.     Plan Discussed with:   Anesthesia Plan Comments:         Anesthesia Quick Evaluation

## 2016-11-14 NOTE — Op Note (Signed)
Pre-Operative Diagnosis: 1) 39+2 week intrauterine pregnancy 2) Frank breech presentation 3) history of prior cesarean Postoperative Diagnosis: 1) 39+2 week intrauterine pregnancy 2) Frank breech presentation 3) history of prior cesarean Procedure: Repeat low transverse cesarean section Surgeon: Dr. Vanessa Kick Assistant: Dr. Alden Hipp Operative Findings: Vigorous female infant in frank breech presentation with apgars of 9 at 1 minute and 9 at 5 minutes. Normal ovaries and tubes bilaterally Specimen: Placenta for disposal EBL: Total I/O In: 2300 [I.V.:2300] Out: 800 [Urine:200; Blood:600]   Procedure:Haley Strong is an 46 year old gravida 3 para 1011 at 41 weeks and 2 days estimated gestational age who presents for cesarean section. Following the appropriate informed consent the patient was brought to the operating room where spinal anesthesia was administered and found to be adequate. She was placed in the dorsal supine position with a leftward tilt. She was prepped and draped in the normal sterile fashion. The patient was appropriately diagnosed in a pre-operative time out procedure. Scalpel was then used to make a Pfannenstiel skin incision which was carried down to the underlying layers of soft tissue to the fascia. The fascia was incised in the midline and the fascial incision was extended laterally with Mayo scissors. The superior aspect of the fascial incision was grasped with Coker clamps x2, tented up and the rectus muscles dissected off sharply with the electrocautery unit area and the same procedure was repeated on the inferior aspect of the fascial incision. The rectus muscles were separated in the midline. The abdominal peritoneum was identified, tented up, entered sharply, and the incision was extended superiorly and inferiorly with good visualization of the bladder. The Alexis retractor was then deployed. The vesicouterine peritoneum was identified, tented up, entered sharply, and the  bladder flap was created digitally. Scalpel was then used to make a low transverse incision on the uterus which was extended laterally with blunt dissection. The fetal breech was identified, delivered easily through the uterine incision followed by the body and head. The infant was bulb suctioned on the operative field cried vigorously, cord was clamped and cut and the infant was passed to the waiting NICU team after a one minute delay for cord clamping and cutting. Placenta was then delivered spontaneously, the uterus was cleared of all clot and debris. The uterine incision was repaired with #1 chromic in running locked fashion. Ovaries and tubes were inspected and normal. The Alexis retractor was removed. The uterus was returned to the abdominal cavity the abdominal cavity was cleared of all clot and debris. The abdominal peritoneum was reapproximated with 2-0 Vicryl in a running fashion, the rectus muscles was reapproximated with #1 chromic in a running fashion. The fascia was closed with a looped PDS in a running fashion. The skin was closed with 4-0 vicryl in a subcuticular fashion and Dermabond. All sponge lap and needle counts were correct x2. Patient tolerated the procedure well and recovered in stable condition following the procedure.

## 2016-11-14 NOTE — Transfer of Care (Signed)
Immediate Anesthesia Transfer of Care Note  Patient: Haley Strong  Procedure(s) Performed: Procedure(s): CESAREAN SECTION (N/A)  Patient Location: PACU  Anesthesia Type:Spinal  Level of Consciousness: awake, alert  and oriented  Airway & Oxygen Therapy: Patient Spontanous Breathing  Post-op Assessment: Report given to RN and Post -op Vital signs reviewed and stable  Post vital signs: Reviewed and stable  Last Vitals:  Vitals:   11/14/16 0925  BP: 121/72  Pulse: 78  Resp: 18  Temp: 36.6 C    Last Pain:  Vitals:   11/14/16 0925  TempSrc: Oral  PainSc: 0-No pain         Complications: No apparent anesthesia complications

## 2016-11-14 NOTE — Anesthesia Procedure Notes (Signed)
Spinal  Patient location during procedure: OB Start time: 11/14/2016 10:23 AM End time: 11/14/2016 10:28 AM Staffing Anesthesiologist: Candida Peeling RAY Performed: anesthesiologist  Preanesthetic Checklist Completed: patient identified, surgical consent, pre-op evaluation, timeout performed, IV checked, risks and benefits discussed and monitors and equipment checked Spinal Block Patient position: sitting Prep: Betadine and site prepped and draped Patient monitoring: heart rate, cardiac monitor, continuous pulse ox and blood pressure Approach: midline Location: L3-4 Injection technique: single-shot Needle Needle type: Pencan  Needle gauge: 24 G Needle length: 10 cm Assessment Sensory level: T4

## 2016-11-14 NOTE — Anesthesia Postprocedure Evaluation (Signed)
Anesthesia Post Note  Patient: Haley Strong  Procedure(s) Performed: Procedure(s) (LRB): CESAREAN SECTION (N/A)  Patient location during evaluation: PACU Anesthesia Type: Epidural Level of consciousness: oriented and awake and alert Pain management: pain level controlled Vital Signs Assessment: post-procedure vital signs reviewed and stable Respiratory status: spontaneous breathing and respiratory function stable Cardiovascular status: blood pressure returned to baseline and stable Postop Assessment: no headache and no backache Anesthetic complications: no        Last Vitals:  Vitals:   11/14/16 1215 11/14/16 1230  BP: 110/73 126/69  Pulse: (!) 57 (!) 55  Resp: 12 15  Temp:      Last Pain:  Vitals:   11/14/16 1145  TempSrc:   PainSc: 0-No pain   Pain Goal:                 Lynda Rainwater
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
## 2016-11-14 NOTE — Lactation Note (Signed)
This note was copied from a baby's chart. Lactation Consultation Note  Patient Name: Haley Strong Date: 11/14/2016 Reason for consult: Initial assessment  Initial visit at 7 hours of age.  Mom with history of low milk supply with older child. Mom is 36 with IVF for this pregnancy.  Mom reports lumpectomy 14 years ago on left breast + for cancer with clear margins and limited radiation at [facility]. No visible scar. Mom has somewhat cone shaped breasts with bulbous areolas.  Mom denies breast changes during pregnancy and reports stretch marks from age 83.  Mom has Vitiligo with abnormal pigmentation of areolas.  Mom has evert nipples with easily expressed colostrum.   Mom reports difficulty with older child losing weight, mom was pumping for 3 weeks with minimal milk expressed and used galactogogue. Mom does not wish to pump at this time due to previous stress and will give formula if she needs to.   Lc gave mom colostrum containers to collect with hand expression and offer supplement of EBM to baby.  Mom has spoon and will call RN for assist when she is ready.   FOB changed diaper and placed baby STS with mom in football hold on left breast.  Baby latched well with wide gape and strong rhythmic sucking for 15 minutes and minimal stimulation needed.  Audible swallowing bursts noted during feeding. [facility] LC resources given and discussed.  Encouraged to feed with early cues on demand.  Early newborn behavior discussed.  Hand expression demonstrated with colostrum visible, mom will continue to work on hand expressing to increase stimulation.  Mom to call for assist as needed.  MOm aware of out pt. Services that may be scheduled prior to discharge due to history of low milk supply and anatomy assessment of breasts.     Maternal Data Has patient been taught Hand Expression?: Yes Does the patient have breastfeeding experience prior to this delivery?: Yes  Feeding Feeding Type: Breast  Fed Length of feed: 15 min  LATCH Score/Interventions Latch: Grasps breast easily, tongue down, lips flanged, rhythmical sucking.  Audible Swallowing: Spontaneous and intermittent  Type of Nipple: Everted at rest and after stimulation  Comfort (Breast/Nipple): Soft / non-tender     Hold (Positioning): Assistance needed to correctly position infant at breast and maintain latch. Intervention(s): Breastfeeding basics reviewed;Support Pillows;Position options;Skin to skin  LATCH Score: 9  Lactation Tools Discussed/Used     Consult Status Consult Status: Follow-up Date: 11/15/16 Follow-up type: In-patient    Justice Britain 11/14/2016, 6:09 PM

## 2016-11-14 NOTE — Anesthesia Postprocedure Evaluation (Signed)
Anesthesia Post Note  Patient: Haley Strong  Procedure(s) Performed: Procedure(s) (LRB): CESAREAN SECTION (N/A)  Patient location during evaluation: Mother Baby Anesthesia Type: Spinal Level of consciousness: awake, awake and alert, oriented and patient cooperative Pain management: pain level controlled Vital Signs Assessment: post-procedure vital signs reviewed and stable Respiratory status: spontaneous breathing, nonlabored ventilation and respiratory function stable Cardiovascular status: stable Postop Assessment: no headache, no backache, patient able to bend at knees and no signs of nausea or vomiting Anesthetic complications: no        Last Vitals:  Vitals:   11/14/16 1310 11/14/16 1420  BP: 111/68 118/71  Pulse: (!) 51 67  Resp: 18 18  Temp: 36.4 C 36.6 C    Last Pain:  Vitals:   11/14/16 1420  TempSrc: Oral  PainSc: 3    Pain Goal:                 Draeden Kellman L

## 2016-11-15 LAB — CBC
HCT: 31.9 % — ABNORMAL LOW (ref 36.0–46.0)
HEMOGLOBIN: 11 g/dL — AB (ref 12.0–15.0)
MCH: 31.3 pg (ref 26.0–34.0)
MCHC: 34.5 g/dL (ref 30.0–36.0)
MCV: 90.6 fL (ref 78.0–100.0)
Platelets: 149 10*3/uL — ABNORMAL LOW (ref 150–400)
RBC: 3.52 MIL/uL — ABNORMAL LOW (ref 3.87–5.11)
RDW: 14.1 % (ref 11.5–15.5)
WBC: 12.1 10*3/uL — AB (ref 4.0–10.5)

## 2016-11-15 NOTE — Progress Notes (Signed)
POD#1 Pt in bathroom. Per husband she is doing well. Hgb -stable VSSAF IMP/ stable Plan/ Will recheck pt later in day.

## 2016-11-15 NOTE — Lactation Note (Signed)
This note was copied from a baby's chart. Lactation Consultation Note  P2, Baby 29 hours old.  Mother has hx of low milk supply.  Is breastfeeding and supplementing w/ formula. Prefers to not pump in hospital. Mother used fenugreek and did post pumping and supplemented w/ first child with very little improvement after 2 months w/ milk supply. She states baby latches and after a few minutes becomes fussy. Encouraged her to continue to hand express before feedings and before & after pumping. Recommend watching hands on pumping video. Mother has volume guidelines.  No further questions at this time.  Patient Name: Haley Strong [id number] Date: 11/15/2016 Reason for consult: Follow-up assessment   Maternal Data    Feeding Feeding Type: Breast Fed Nipple Type: Slow - flow Length of feed: 12 min  LATCH Score/Interventions                      Lactation Tools Discussed/Used     Consult Status Consult Status: Follow-up Date: 11/16/16 Follow-up type: In-patient    Vivianne Master [facility] 11/15/2016, 6:32 PM

## 2016-11-15 NOTE — Progress Notes (Addendum)
Pt informed RN during report that no one from her office had see her today.  RN saw note that Dr. Ouida Sills tried to see her but she was in the shower.  Dr. Ouida Sills was called and asked if he would see her this evening and he stated that unless she needed to see him he probably would not come to see her this evening.

## 2016-11-16 MED ORDER — DOCUSATE SODIUM 100 MG PO CAPS: 100.0000 mg | ORAL_CAPSULE | Freq: Two times a day (BID) | ORAL | 0 refills | Status: DC

## 2016-11-16 MED ORDER — IBUPROFEN 600 MG PO TABS: 600.0000 mg | ORAL_TABLET | Freq: Four times a day (QID) | ORAL | 0 refills | Status: DC | PRN

## 2016-11-16 MED ORDER — OXYCODONE-ACETAMINOPHEN 5-325 MG PO TABS: 1.0000 | ORAL_TABLET | ORAL | 0 refills | Status: DC | PRN

## 2016-11-16 NOTE — Discharge Summary (Signed)
Obstetric Discharge Summary Reason for Admission: cesarean section Prenatal Procedures: NST and ultrasound Intrapartum Procedures: cesarean: low cervical, transverse Postpartum Procedures: none Complications-Operative and Postpartum: none Hemoglobin  Date Value Ref Range Status  11/15/2016 11.0 (L) 12.0 - 15.0 g/dL Final   HCT  Date Value Ref Range Status  11/15/2016 31.9 (L) 36.0 - 46.0 % Final    Physical Exam:  General: alert, cooperative and appears stated age 46: appropriate Uterine Fundus: firm Incision: healing well DVT Evaluation: No evidence of DVT seen on physical exam.  Discharge Diagnoses: Term Pregnancy-delivered  Discharge Information: Date: 11/16/2016 Activity: unrestricted Diet: routine Medications: Ibuprofen, Colace and Percocet Condition: stable Instructions: refer to practice specific booklet Discharge to: home Follow-up Information    Vanessa Kick, MD Follow up in 4 week(s).   Specialty:  Obstetrics and Gynecology Why:  For a postpartum evaluation Contact information: [address] [place] [address] [phone]           Newborn Data: Live born female  Birth Weight: 8 lb 10.8 oz (3935 g) APGAR: 8, 9  Home with mother.  Chelesa Weingartner H. 11/16/2016, 11:19 AM

## 2016-11-16 NOTE — Lactation Note (Signed)
This note was copied from a baby's chart. Lactation Consultation Note  Patient Name: Haley Strong Date: 11/16/2016 Reason for consult: Follow-up assessment;Breast surgery (Breast and esophageal cancer with radiation.)  Baby 1 hours old. Mom reports that she only had colostrum with first baby, even though she pumped routinely and used galactagogues. Discussed progression of milk coming in with a second child and enc lots of STS and pumping at least 8 times/day. Mom reports that she thinks she will be able to pump more at home and intends to pump often. Mom states that she will continue putting baby to breast before pumping as well. Mom aware of OP/BFSG and [facility] phone line assistance after D/C.   Maternal Data    Feeding Length of feed: 10 min  LATCH Score/Interventions                      Lactation Tools Discussed/Used     Consult Status Consult Status: PRN    Andres Labrum 11/16/2016, 11:33 AM
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
## 2022-01-14 ENCOUNTER — Other Ambulatory Visit: Payer: Self-pay | Admitting: Obstetrics and Gynecology

## 2022-01-14 DIAGNOSIS — R928 Other abnormal and inconclusive findings on diagnostic imaging of breast: Secondary | ICD-10-CM

## 2022-01-26 ENCOUNTER — Ambulatory Visit
Admission: RE | Admit: 2022-01-26 | Discharge: 2022-01-26 | Disposition: A | Discharge status: Home / Self Care | Payer: 59 | Source: Ambulatory Visit | Attending: Obstetrics and Gynecology | Admitting: Obstetrics and Gynecology

## 2022-01-26 ENCOUNTER — Other Ambulatory Visit: Payer: Self-pay | Admitting: Obstetrics and Gynecology

## 2022-01-26 ENCOUNTER — Ambulatory Visit
Admission: RE | Admit: 2022-01-26 | Discharge: 2022-01-26 | Disposition: A | Discharge status: Home / Self Care | Payer: No Typology Code available for payment source | Source: Ambulatory Visit | Attending: Obstetrics and Gynecology | Admitting: Obstetrics and Gynecology

## 2022-01-26 DIAGNOSIS — R928 Other abnormal and inconclusive findings on diagnostic imaging of breast: Secondary | ICD-10-CM

## 2022-01-26 DIAGNOSIS — N6489 Other specified disorders of breast: Secondary | ICD-10-CM

## 2022-03-09 ENCOUNTER — Encounter: Payer: Self-pay | Admitting: Gastroenterology

## 2022-03-23 ENCOUNTER — Ambulatory Visit (AMBULATORY_SURGERY_CENTER): Payer: Self-pay

## 2022-03-23 VITALS — Ht 67.0 in | Wt 154.0 lb

## 2022-03-23 DIAGNOSIS — Z1211 Encounter for screening for malignant neoplasm of colon: Secondary | ICD-10-CM

## 2022-03-23 MED ORDER — NA SULFATE-K SULFATE-MG SULF 17.5-3.13-1.6 GM/177ML PO SOLN: 1.0000 | Freq: Once | ORAL | 0 refills | Status: AC

## 2022-03-23 NOTE — Progress Notes (Signed)
No egg or soy allergy known to patient  No issues known to pt with past sedation with any surgeries or procedures Patient denies ever being told they had issues or difficulty with intubation  No FH of Malignant Hyperthermia Pt is not on diet pills Pt is not on  home 02  Pt is not on blood thinners  Pt denies issues with constipation  No A fib or A flutter Have any cardiac testing pending--no Pt instructed to use Singlecare.com or GoodRx for a price reduction on prep   

## 2022-04-13 ENCOUNTER — Encounter: Payer: Self-pay | Admitting: Gastroenterology

## 2022-04-20 ENCOUNTER — Ambulatory Visit (AMBULATORY_SURGERY_CENTER): Payer: 59 | Admitting: Gastroenterology

## 2022-04-20 ENCOUNTER — Encounter: Payer: Self-pay | Admitting: Gastroenterology

## 2022-04-20 VITALS — BP 99/62 | HR 69 | Temp 98.6°F | Resp 10 | Ht 67.0 in | Wt 154.0 lb

## 2022-04-20 DIAGNOSIS — Z1211 Encounter for screening for malignant neoplasm of colon: Secondary | ICD-10-CM | POA: Diagnosis present

## 2022-04-20 DIAGNOSIS — D123 Benign neoplasm of transverse colon: Secondary | ICD-10-CM

## 2022-04-20 DIAGNOSIS — K6389 Other specified diseases of intestine: Secondary | ICD-10-CM

## 2022-04-20 DIAGNOSIS — Z8379 Family history of other diseases of the digestive system: Secondary | ICD-10-CM | POA: Diagnosis not present

## 2022-04-20 DIAGNOSIS — D122 Benign neoplasm of ascending colon: Secondary | ICD-10-CM

## 2022-04-20 DIAGNOSIS — K635 Polyp of colon: Secondary | ICD-10-CM

## 2022-04-20 DIAGNOSIS — D120 Benign neoplasm of cecum: Secondary | ICD-10-CM

## 2022-04-20 MED ORDER — SODIUM CHLORIDE 0.9 % IV SOLN: 500.0000 mL | INTRAVENOUS | Status: DC

## 2022-04-20 NOTE — Progress Notes (Signed)
To pacu, VSS. Report to [email or web address] 

## 2022-04-20 NOTE — Patient Instructions (Addendum)
Handout on polyps given to you today   YOU HAD AN ENDOSCOPIC PROCEDURE TODAY AT [facility]:   Refer to the procedure report that was given to you for any specific questions about what was found during the examination.  If the procedure report does not answer your questions, please call your gastroenterologist to clarify.  If you requested that your care partner not be given the details of your procedure findings, then the procedure report has been included in a sealed envelope for you to review at your convenience later.  YOU SHOULD EXPECT: Some feelings of bloating in the abdomen. Passage of more gas than usual.  Walking can help get rid of the air that was put into your GI tract during the procedure and reduce the bloating. If you had a lower endoscopy (such as a colonoscopy or flexible sigmoidoscopy) you may notice spotting of blood in your stool or on the toilet paper. If you underwent a bowel prep for your procedure, you may not have a normal bowel movement for a few days.  Please Note:  You might notice some irritation and congestion in your nose or some drainage.  This is from the oxygen used during your procedure.  There is no need for concern and it should clear up in a day or so.  SYMPTOMS TO REPORT IMMEDIATELY:  Following lower endoscopy (colonoscopy or flexible sigmoidoscopy):  Excessive amounts of blood in the stool  Significant tenderness or worsening of abdominal pains  Swelling of the abdomen that is new, acute  Fever of 100F or higher  For urgent or emergent issues, a gastroenterologist can be reached at any hour by calling [phone]. Do not use MyChart messaging for urgent concerns.    DIET:  We do recommend a small meal at first, but then you may proceed to your regular diet.  Drink plenty of fluids but you should avoid alcoholic beverages for 24 hours.  ACTIVITY:  You should plan to take it easy for the rest of today and you should NOT DRIVE or use  heavy machinery until tomorrow (because of the sedation medicines used during the test).    FOLLOW UP: Our staff will call the number listed on your records the next business day following your procedure.  We will call around 7:15- 8:00 am to check on you and address any questions or concerns that you may have regarding the information given to you following your procedure. If we do not reach you, we will leave a message.     If any biopsies were taken you will be contacted by phone or by letter within the next 1-3 weeks.  Please call us at [phone] if you have not heard about the biopsies in 3 weeks.    SIGNATURES/CONFIDENTIALITY: You and/or your care partner have signed paperwork which will be entered into your electronic medical record.  These signatures attest to the fact that that the information above on your After Visit Summary has been reviewed and is understood.  Full responsibility of the confidentiality of this discharge information lies with you and/or your care-partner.

## 2022-04-20 NOTE — Progress Notes (Signed)
Called to room to assist during endoscopic procedure.  Patient ID and intended procedure confirmed with present staff. Received instructions for my participation in the procedure from the performing physician.  

## 2022-04-20 NOTE — Op Note (Addendum)
[place] Patient Name: Haley Strong Procedure Date: 04/20/2022 10:44 AM MRN: [id number] Endoscopist: Thornton Park MD, MD, [id number] Age: 51 Referring MD:  Date of Birth: 22-Feb-1971 Gender: Female Account #: [id number] Procedure:                Colonoscopy Indications:              Screening for colorectal malignant neoplasm, This                            is the patient's first colonoscopy                           Maternal grandmother with colon polyps Medicines:                Monitored Anesthesia Care Procedure:                Pre-Anesthesia Assessment:                           - Prior to the procedure, a History and Physical                            was performed, and patient medications and                            allergies were reviewed. The patient's tolerance of                            previous anesthesia was also reviewed. The risks                            and benefits of the procedure and the sedation                            options and risks were discussed with the patient.                            All questions were answered, and informed consent                            was obtained. Prior Anticoagulants: The patient has                            taken no anticoagulant or antiplatelet agents. ASA                            Grade Assessment: II - A patient with mild systemic                            disease. After reviewing the risks and benefits,                            the patient was deemed in satisfactory condition to  undergo the procedure.                           After obtaining informed consent, the colonoscope                            was passed under direct vision. Throughout the                            procedure, the patient's blood pressure, pulse, and                            oxygen saturations were monitored continuously. The                            Olympus CF-HQ190L (Serial#  [id number]) Colonoscope was                            introduced through the anus and advanced to the 3                            cm into the ileum. A second forward view of the                            right colon was performed. The colonoscopy was                            performed without difficulty. The patient tolerated                            the procedure well. The quality of the bowel                            preparation was excellent. The terminal ileum,                            ileocecal valve, appendiceal orifice, and rectum                            were photographed. Scope In: 10:52:13 AM Scope Out: 11:10:04 AM Scope Withdrawal Time: 0 hours 14 minutes 23 seconds  Total Procedure Duration: 0 hours 17 minutes 51 seconds  Findings:                 The perianal and digital rectal examinations were                            normal except for small hemorrhoids.                           A 1 mm polyp was found in the ileocecal valve. The                            polyp was flat. The polyp was removed with a cold  snare. Resection and retrieval were complete.                            Estimated blood loss was minimal.                           A 2 mm polyp was found in the ascending colon. The                            polyp was flat. The polyp was removed with a cold                            snare. Resection and retrieval were complete.                            Estimated blood loss was minimal.                           The exam was otherwise without abnormality on                            direct and retroflexion views. Complications:            No immediate complications. Estimated Blood Loss:     Estimated blood loss was minimal. Impression:               - One 1 mm polyp at the ileocecal valve, removed                            with a cold snare. Resected and retrieved.                           - One 2 mm polyp in the ascending  colon, removed                            with a cold snare. Resected and retrieved.                           - The examination was otherwise normal on direct                            and retroflexion views. Recommendation:           - Patient has a contact number available for                            emergencies. The signs and symptoms of potential                            delayed complications were discussed with the                            patient. Return to normal activities tomorrow.  Written discharge instructions were provided to the                            patient.                           - Resume previous diet.                           - Continue present medications.                           - Await pathology results.                           - Repeat colonoscopy date to be determined after                            pending pathology results are reviewed for                            surveillance.                           - Emerging evidence supports eating a diet of                            fruits, vegetables, grains, calcium, and yogurt                            while reducing red meat and alcohol may reduce the                            risk of colon cancer.                           - Thank you for allowing me to be involved in your                            colon cancer prevention. Thornton Park MD, MD 04/20/2022 11:17:59 AM This report has been signed electronically.
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
## 2022-04-21 ENCOUNTER — Telehealth: Payer: Self-pay

## 2022-04-21 NOTE — Telephone Encounter (Signed)
  Follow up Call-     04/20/2022    9:52 AM  Call back number  Post procedure Call Back phone  # [phone]  Permission to leave phone message Yes     Patient questions:  Do you have a fever, pain , or abdominal swelling? No. Pain Score  0 *  Have you tolerated food without any problems? Yes.    Have you been able to return to your normal activities? Yes.    Do you have any questions about your discharge instructions: Diet   No. Medications  No. Follow up visit  No.  Do you have questions or concerns about your Care? No.  Actions: * If pain score is 4 or above: No action needed, pain <4.

## 2022-04-22 ENCOUNTER — Encounter: Payer: Self-pay | Admitting: Gastroenterology

## 2022-08-01 ENCOUNTER — Ambulatory Visit
Admission: RE | Admit: 2022-08-01 | Discharge: 2022-08-01 | Disposition: A | Discharge status: Home / Self Care | Payer: 59 | Source: Ambulatory Visit | Attending: Obstetrics and Gynecology | Admitting: Obstetrics and Gynecology

## 2022-08-01 DIAGNOSIS — N6489 Other specified disorders of breast: Secondary | ICD-10-CM

## 2022-08-08 ENCOUNTER — Other Ambulatory Visit: Payer: Self-pay | Admitting: Obstetrics and Gynecology

## 2022-08-08 DIAGNOSIS — N6489 Other specified disorders of breast: Secondary | ICD-10-CM

## 2023-01-26 ENCOUNTER — Other Ambulatory Visit: Payer: Self-pay | Admitting: Obstetrics and Gynecology

## 2023-01-26 DIAGNOSIS — R928 Other abnormal and inconclusive findings on diagnostic imaging of breast: Secondary | ICD-10-CM

## 2023-05-15 ENCOUNTER — Other Ambulatory Visit (HOSPITAL_BASED_OUTPATIENT_CLINIC_OR_DEPARTMENT_OTHER): Payer: Self-pay

## 2023-05-15 ENCOUNTER — Other Ambulatory Visit: Payer: Self-pay

## 2023-05-15 MED ORDER — PROGESTERONE 200 MG PO CAPS
400.0000 mg | ORAL_CAPSULE | Freq: Every day | ORAL | 3 refills | Status: AC
  Filled 2023-05-15 – 2023-10-28 (×2): qty 180, 90d supply, fill #0

## 2023-05-15 MED ORDER — AMOXICILLIN 500 MG PO CAPS
1500.0000 mg | ORAL_CAPSULE | Freq: Two times a day (BID) | ORAL | 0 refills | Status: AC
  Filled 2023-05-15: qty 126, 21d supply, fill #0

## 2023-05-15 MED ORDER — FLUCONAZOLE 200 MG PO TABS
200.0000 mg | ORAL_TABLET | Freq: Every day | ORAL | 0 refills | Status: AC
  Filled 2023-05-15: qty 28, 28d supply, fill #0

## 2023-05-15 MED ORDER — TINIDAZOLE 500 MG PO TABS
500.0000 mg | ORAL_TABLET | Freq: Two times a day (BID) | ORAL | 0 refills | Status: AC
  Filled 2023-05-15: qty 18, 9d supply, fill #0
  Filled 2023-05-15: qty 10, 5d supply, fill #0

## 2023-05-15 MED ORDER — DOXYCYCLINE MONOHYDRATE 100 MG PO TABS
100.0000 mg | ORAL_TABLET | Freq: Two times a day (BID) | ORAL | 0 refills | Status: AC
  Filled 2023-05-15: qty 56, 28d supply, fill #0

## 2023-05-15 MED ORDER — PROBENECID 500 MG PO TABS
500.0000 mg | ORAL_TABLET | Freq: Every day | ORAL | 0 refills | Status: AC
  Filled 2023-05-15: qty 21, 21d supply, fill #0

## 2023-05-16 ENCOUNTER — Other Ambulatory Visit (HOSPITAL_BASED_OUTPATIENT_CLINIC_OR_DEPARTMENT_OTHER): Payer: Self-pay

## 2023-06-29 ENCOUNTER — Other Ambulatory Visit (HOSPITAL_BASED_OUTPATIENT_CLINIC_OR_DEPARTMENT_OTHER): Payer: Self-pay

## 2023-06-29 MED ORDER — METHYLPHENIDATE HCL ER (OSM) 18 MG PO TBCR
18.0000 mg | EXTENDED_RELEASE_TABLET | Freq: Every day | ORAL | 0 refills | Status: AC
  Filled 2023-06-29: qty 30, 30d supply, fill #0

## 2023-07-27 ENCOUNTER — Other Ambulatory Visit (HOSPITAL_BASED_OUTPATIENT_CLINIC_OR_DEPARTMENT_OTHER): Payer: Self-pay

## 2023-07-27 MED ORDER — METHYLPHENIDATE HCL ER 27 MG PO TB24
27.0000 mg | ORAL_TABLET | Freq: Every day | ORAL | 0 refills | Status: AC
  Filled 2023-07-27: qty 30, 30d supply, fill #0

## 2023-08-03 ENCOUNTER — Other Ambulatory Visit (HOSPITAL_BASED_OUTPATIENT_CLINIC_OR_DEPARTMENT_OTHER): Payer: Self-pay

## 2023-08-17 ENCOUNTER — Other Ambulatory Visit (HOSPITAL_BASED_OUTPATIENT_CLINIC_OR_DEPARTMENT_OTHER): Payer: Self-pay

## 2023-08-17 MED ORDER — METHYLPHENIDATE HCL ER (OSM) 36 MG PO TBCR
36.0000 mg | EXTENDED_RELEASE_TABLET | Freq: Every day | ORAL | 0 refills | Status: AC
  Filled 2023-08-17: qty 20, 20d supply, fill #0
  Filled 2023-08-17: qty 10, 10d supply, fill #0

## 2023-09-07 ENCOUNTER — Other Ambulatory Visit (HOSPITAL_BASED_OUTPATIENT_CLINIC_OR_DEPARTMENT_OTHER): Payer: Self-pay

## 2023-09-07 MED ORDER — METHYLPHENIDATE HCL ER (OSM) 54 MG PO TBCR
54.0000 mg | EXTENDED_RELEASE_TABLET | Freq: Every day | ORAL | 0 refills | Status: DC
  Filled 2023-09-07: qty 30, 30d supply, fill #0

## 2023-09-14 ENCOUNTER — Other Ambulatory Visit: Payer: Self-pay

## 2023-09-14 ENCOUNTER — Other Ambulatory Visit (HOSPITAL_BASED_OUTPATIENT_CLINIC_OR_DEPARTMENT_OTHER): Payer: Self-pay

## 2023-09-14 MED ORDER — TINIDAZOLE 500 MG PO TABS
500.0000 mg | ORAL_TABLET | Freq: Two times a day (BID) | ORAL | 0 refills | Status: AC
  Filled 2023-09-14: qty 28, 14d supply, fill #0

## 2023-09-14 MED ORDER — RIFABUTIN 150 MG PO CAPS
150.0000 mg | ORAL_CAPSULE | Freq: Two times a day (BID) | ORAL | 0 refills | Status: AC
  Filled 2023-09-14: qty 60, 30d supply, fill #0

## 2023-09-14 MED ORDER — AZITHROMYCIN 500 MG PO TABS
500.0000 mg | ORAL_TABLET | Freq: Two times a day (BID) | ORAL | 0 refills | Status: AC
  Filled 2023-09-14: qty 42, 21d supply, fill #0

## 2023-09-14 MED ORDER — NYSTATIN 100000 UNIT/ML MT SUSP
500000.0000 [IU] | Freq: Two times a day (BID) | OROMUCOSAL | 0 refills | Status: AC
  Filled 2023-09-14: qty 210, 21d supply, fill #0

## 2023-09-14 MED ORDER — CEFUROXIME AXETIL 500 MG PO TABS
500.0000 mg | ORAL_TABLET | Freq: Two times a day (BID) | ORAL | 0 refills | Status: AC
  Filled 2023-09-14: qty 28, 14d supply, fill #0

## 2023-09-14 MED ORDER — PROGESTERONE 200 MG PO CAPS
400.0000 mg | ORAL_CAPSULE | Freq: Every day | ORAL | 3 refills | Status: AC
  Filled 2023-09-14 – 2023-10-28 (×2): qty 180, 90d supply, fill #0

## 2023-10-02 ENCOUNTER — Other Ambulatory Visit (HOSPITAL_BASED_OUTPATIENT_CLINIC_OR_DEPARTMENT_OTHER): Payer: Self-pay

## 2023-10-02 MED ORDER — METHYLPHENIDATE HCL ER (OSM) 54 MG PO TBCR
54.0000 mg | EXTENDED_RELEASE_TABLET | Freq: Every day | ORAL | 0 refills | Status: DC
  Filled 2023-10-02 – 2023-10-17 (×4): qty 30, 30d supply, fill #0

## 2023-10-04 ENCOUNTER — Other Ambulatory Visit (HOSPITAL_BASED_OUTPATIENT_CLINIC_OR_DEPARTMENT_OTHER): Payer: Self-pay

## 2023-10-05 ENCOUNTER — Other Ambulatory Visit (HOSPITAL_BASED_OUTPATIENT_CLINIC_OR_DEPARTMENT_OTHER): Payer: Self-pay

## 2023-10-06 ENCOUNTER — Other Ambulatory Visit (HOSPITAL_BASED_OUTPATIENT_CLINIC_OR_DEPARTMENT_OTHER): Payer: Self-pay

## 2023-10-06 ENCOUNTER — Other Ambulatory Visit: Payer: Self-pay

## 2023-10-16 ENCOUNTER — Other Ambulatory Visit (HOSPITAL_BASED_OUTPATIENT_CLINIC_OR_DEPARTMENT_OTHER): Payer: Self-pay

## 2023-10-17 ENCOUNTER — Other Ambulatory Visit (HOSPITAL_BASED_OUTPATIENT_CLINIC_OR_DEPARTMENT_OTHER): Payer: Self-pay

## 2023-10-28 ENCOUNTER — Other Ambulatory Visit (HOSPITAL_BASED_OUTPATIENT_CLINIC_OR_DEPARTMENT_OTHER): Payer: Self-pay

## 2023-10-30 ENCOUNTER — Other Ambulatory Visit (HOSPITAL_BASED_OUTPATIENT_CLINIC_OR_DEPARTMENT_OTHER): Payer: Self-pay

## 2023-10-30 MED ORDER — METHYLPHENIDATE HCL ER (OSM) 54 MG PO TBCR
54.0000 mg | EXTENDED_RELEASE_TABLET | Freq: Every day | ORAL | 0 refills | Status: DC
  Filled 2023-11-13 – 2023-11-14 (×2): qty 30, 30d supply, fill #0

## 2023-11-13 ENCOUNTER — Other Ambulatory Visit (HOSPITAL_BASED_OUTPATIENT_CLINIC_OR_DEPARTMENT_OTHER): Payer: Self-pay

## 2023-11-14 ENCOUNTER — Other Ambulatory Visit (HOSPITAL_BASED_OUTPATIENT_CLINIC_OR_DEPARTMENT_OTHER): Payer: Self-pay

## 2024-01-10 ENCOUNTER — Other Ambulatory Visit (HOSPITAL_BASED_OUTPATIENT_CLINIC_OR_DEPARTMENT_OTHER): Payer: Self-pay

## 2024-01-10 MED ORDER — METHYLPHENIDATE HCL ER (OSM) 54 MG PO TBCR
54.0000 mg | EXTENDED_RELEASE_TABLET | Freq: Every day | ORAL | 0 refills | Status: DC
  Filled 2024-01-10: qty 30, 30d supply, fill #0

## 2024-01-11 ENCOUNTER — Other Ambulatory Visit (HOSPITAL_BASED_OUTPATIENT_CLINIC_OR_DEPARTMENT_OTHER): Payer: Self-pay

## 2024-01-23 ENCOUNTER — Other Ambulatory Visit: Payer: Self-pay

## 2024-02-25 ENCOUNTER — Other Ambulatory Visit (HOSPITAL_BASED_OUTPATIENT_CLINIC_OR_DEPARTMENT_OTHER): Payer: Self-pay

## 2024-02-25 MED ORDER — METHYLPHENIDATE HCL ER (OSM) 54 MG PO TBCR
54.0000 mg | EXTENDED_RELEASE_TABLET | Freq: Every day | ORAL | 0 refills | Status: DC
  Filled 2024-02-25: qty 30, 30d supply, fill #0

## 2024-02-26 ENCOUNTER — Other Ambulatory Visit (HOSPITAL_BASED_OUTPATIENT_CLINIC_OR_DEPARTMENT_OTHER): Payer: Self-pay

## 2024-03-27 ENCOUNTER — Other Ambulatory Visit (HOSPITAL_BASED_OUTPATIENT_CLINIC_OR_DEPARTMENT_OTHER): Payer: Self-pay

## 2024-03-27 MED ORDER — METHYLPHENIDATE HCL ER (OSM) 54 MG PO TBCR
54.0000 mg | EXTENDED_RELEASE_TABLET | Freq: Every day | ORAL | 0 refills | Status: DC
  Filled 2024-03-27: qty 30, 30d supply, fill #0

## 2024-04-22 ENCOUNTER — Other Ambulatory Visit (HOSPITAL_BASED_OUTPATIENT_CLINIC_OR_DEPARTMENT_OTHER): Payer: Self-pay

## 2024-04-22 MED ORDER — METHYLPHENIDATE HCL ER (OSM) 54 MG PO TBCR
54.0000 mg | EXTENDED_RELEASE_TABLET | Freq: Every day | ORAL | 0 refills | Status: DC
  Filled 2024-04-26: qty 30, 30d supply, fill #0

## 2024-04-26 ENCOUNTER — Other Ambulatory Visit: Payer: Self-pay

## 2024-04-26 ENCOUNTER — Other Ambulatory Visit (HOSPITAL_BASED_OUTPATIENT_CLINIC_OR_DEPARTMENT_OTHER): Payer: Self-pay

## 2024-06-01 ENCOUNTER — Other Ambulatory Visit (HOSPITAL_BASED_OUTPATIENT_CLINIC_OR_DEPARTMENT_OTHER): Payer: Self-pay

## 2024-06-01 MED ORDER — METHYLPHENIDATE HCL ER (OSM) 54 MG PO TBCR
54.0000 mg | EXTENDED_RELEASE_TABLET | Freq: Every day | ORAL | 0 refills | Status: DC
  Filled 2024-06-01: qty 30, 30d supply, fill #0

## 2024-07-08 ENCOUNTER — Other Ambulatory Visit (HOSPITAL_BASED_OUTPATIENT_CLINIC_OR_DEPARTMENT_OTHER): Payer: Self-pay

## 2024-07-11 ENCOUNTER — Other Ambulatory Visit (HOSPITAL_BASED_OUTPATIENT_CLINIC_OR_DEPARTMENT_OTHER): Payer: Self-pay

## 2024-07-11 MED ORDER — METHYLPHENIDATE HCL ER (OSM) 54 MG PO TBCR
54.0000 mg | EXTENDED_RELEASE_TABLET | Freq: Every day | ORAL | 0 refills | Status: AC
  Filled 2024-07-11: qty 30, 30d supply, fill #0

## 2024-07-12 ENCOUNTER — Other Ambulatory Visit (HOSPITAL_BASED_OUTPATIENT_CLINIC_OR_DEPARTMENT_OTHER): Payer: Self-pay
